# Patient Record
Sex: Female | Born: 1955 | Race: White | Hispanic: No | Marital: Single | Smoking: Current every day smoker
Health system: Southern US, Community
[De-identification: ages and names within clinical notes are randomized; demographics above are authoritative.]

## PROBLEM LIST (undated history)

## (undated) DIAGNOSIS — G8921 Chronic pain due to trauma: Secondary | ICD-10-CM

## (undated) DIAGNOSIS — G43719 Chronic migraine without aura, intractable, without status migrainosus: Secondary | ICD-10-CM

## (undated) DIAGNOSIS — K56609 Unspecified intestinal obstruction, unspecified as to partial versus complete obstruction: Secondary | ICD-10-CM

## (undated) DIAGNOSIS — Z87440 Personal history of urinary (tract) infections: Secondary | ICD-10-CM

## (undated) DIAGNOSIS — R292 Abnormal reflex: Secondary | ICD-10-CM

## (undated) DIAGNOSIS — IMO0002 Reserved for concepts with insufficient information to code with codable children: Secondary | ICD-10-CM

## (undated) DIAGNOSIS — G629 Polyneuropathy, unspecified: Secondary | ICD-10-CM

## (undated) DIAGNOSIS — F064 Anxiety disorder due to known physiological condition: Secondary | ICD-10-CM

## (undated) DIAGNOSIS — K219 Gastro-esophageal reflux disease without esophagitis: Secondary | ICD-10-CM

## (undated) DIAGNOSIS — M62838 Other muscle spasm: Secondary | ICD-10-CM

## (undated) DIAGNOSIS — M1990 Unspecified osteoarthritis, unspecified site: Secondary | ICD-10-CM

## (undated) DIAGNOSIS — G8220 Paraplegia, unspecified: Secondary | ICD-10-CM

## (undated) DIAGNOSIS — Z87442 Personal history of urinary calculi: Secondary | ICD-10-CM

## (undated) DIAGNOSIS — M1710 Unilateral primary osteoarthritis, unspecified knee: Secondary | ICD-10-CM

## (undated) HISTORY — DX: Chronic migraine without aura, intractable, without status migrainosus: G43.719

## (undated) HISTORY — DX: Chronic pain due to trauma: G89.21

## (undated) HISTORY — DX: Anxiety disorder due to known physiological condition: F06.4

## (undated) HISTORY — DX: Polyneuropathy, unspecified: G62.9

## (undated) HISTORY — DX: Paraplegia, unspecified: G82.20

## (undated) HISTORY — DX: Abnormal reflex: R29.2

## (undated) HISTORY — DX: Gastro-esophageal reflux disease without esophagitis: K21.9

## (undated) HISTORY — DX: Personal history of urinary calculi: Z87.442

## (undated) HISTORY — DX: Personal history of urinary (tract) infections: Z87.440

## (undated) HISTORY — DX: Unspecified osteoarthritis, unspecified site: M19.90

## (undated) HISTORY — DX: Unspecified intestinal obstruction, unspecified as to partial versus complete obstruction: K56.609

## (undated) HISTORY — DX: Unilateral primary osteoarthritis, unspecified knee: M17.10

## (undated) HISTORY — DX: Reserved for concepts with insufficient information to code with codable children: IMO0002

## (undated) HISTORY — DX: Other muscle spasm: M62.838

## (undated) HISTORY — PX: SMALL INTESTINE SURGERY: SHX150

## (undated) HISTORY — PX: LITHOTRIPSY: SUR834

---

## 1979-01-29 HISTORY — PX: OTHER SURGICAL HISTORY: SHX169

## 2008-12-13 ENCOUNTER — Encounter: Admission: RE | Admit: 2008-12-13 | Discharge: 2008-12-13 | Payer: Self-pay | Admitting: Internal Medicine

## 2009-10-25 ENCOUNTER — Emergency Department (HOSPITAL_COMMUNITY): Admission: EM | Admit: 2009-10-25 | Discharge: 2009-10-25 | Payer: Self-pay | Admitting: Emergency Medicine

## 2009-11-18 ENCOUNTER — Emergency Department (HOSPITAL_COMMUNITY): Admission: EM | Admit: 2009-11-18 | Discharge: 2009-11-18 | Payer: Self-pay | Admitting: Emergency Medicine

## 2009-11-25 ENCOUNTER — Emergency Department (HOSPITAL_COMMUNITY): Admission: EM | Admit: 2009-11-25 | Discharge: 2009-11-25 | Payer: Self-pay | Admitting: Emergency Medicine

## 2010-01-08 ENCOUNTER — Encounter
Admission: RE | Admit: 2010-01-08 | Discharge: 2010-01-25 | Payer: Self-pay | Source: Home / Self Care | Attending: Emergency Medicine | Admitting: Emergency Medicine

## 2010-05-07 ENCOUNTER — Other Ambulatory Visit: Payer: Self-pay | Admitting: Geriatric Medicine

## 2010-05-07 DIAGNOSIS — Z1231 Encounter for screening mammogram for malignant neoplasm of breast: Secondary | ICD-10-CM

## 2010-05-10 ENCOUNTER — Ambulatory Visit: Payer: Self-pay

## 2010-05-16 ENCOUNTER — Ambulatory Visit
Admission: RE | Admit: 2010-05-16 | Discharge: 2010-05-16 | Disposition: A | Discharge status: Home / Self Care | Payer: Medicare Other | Source: Ambulatory Visit | Attending: Geriatric Medicine | Admitting: Geriatric Medicine

## 2010-05-16 DIAGNOSIS — Z1231 Encounter for screening mammogram for malignant neoplasm of breast: Secondary | ICD-10-CM

## 2010-05-31 ENCOUNTER — Emergency Department (HOSPITAL_COMMUNITY): Payer: Medicare Other

## 2010-05-31 ENCOUNTER — Emergency Department (HOSPITAL_COMMUNITY)
Admission: EM | Admit: 2010-05-31 | Discharge: 2010-05-31 | Disposition: A | Discharge status: Home / Self Care | Payer: Medicare Other | Attending: Emergency Medicine | Admitting: Emergency Medicine

## 2010-05-31 DIAGNOSIS — Z87828 Personal history of other (healed) physical injury and trauma: Secondary | ICD-10-CM | POA: Insufficient documentation

## 2010-05-31 DIAGNOSIS — R1032 Left lower quadrant pain: Secondary | ICD-10-CM | POA: Insufficient documentation

## 2010-05-31 DIAGNOSIS — K5900 Constipation, unspecified: Secondary | ICD-10-CM | POA: Insufficient documentation

## 2010-05-31 DIAGNOSIS — G8250 Quadriplegia, unspecified: Secondary | ICD-10-CM | POA: Insufficient documentation

## 2010-05-31 DIAGNOSIS — E119 Type 2 diabetes mellitus without complications: Secondary | ICD-10-CM | POA: Insufficient documentation

## 2010-05-31 LAB — CBC
MCH: 29.8 pg (ref 26.0–34.0)
MCHC: 33.8 g/dL (ref 30.0–36.0)
MCV: 88.3 fL (ref 78.0–100.0)
Platelets: 264 10*3/uL (ref 150–400)

## 2010-05-31 LAB — WET PREP, GENITAL: Yeast Wet Prep HPF POC: NONE SEEN

## 2010-05-31 LAB — DIFFERENTIAL
Basophils Relative: 1 % (ref 0–1)
Eosinophils Absolute: 0.1 10*3/uL (ref 0.0–0.7)
Monocytes Absolute: 0.4 10*3/uL (ref 0.1–1.0)
Monocytes Relative: 7 % (ref 3–12)

## 2010-05-31 LAB — URINALYSIS, ROUTINE W REFLEX MICROSCOPIC
Bilirubin Urine: NEGATIVE
Glucose, UA: NEGATIVE mg/dL
Ketones, ur: NEGATIVE mg/dL
Nitrite: NEGATIVE
Protein, ur: NEGATIVE mg/dL

## 2010-05-31 LAB — URINE MICROSCOPIC-ADD ON

## 2010-05-31 LAB — COMPREHENSIVE METABOLIC PANEL
ALT: 16 U/L (ref 0–35)
Albumin: 3 g/dL — ABNORMAL LOW (ref 3.5–5.2)
Alkaline Phosphatase: 202 U/L — ABNORMAL HIGH (ref 39–117)
BUN: 13 mg/dL (ref 6–23)
Chloride: 99 mEq/L (ref 96–112)
Potassium: 3.8 mEq/L (ref 3.5–5.1)
Sodium: 137 mEq/L (ref 135–145)
Total Bilirubin: 0.2 mg/dL — ABNORMAL LOW (ref 0.3–1.2)
Total Protein: 6.9 g/dL (ref 6.0–8.3)

## 2010-05-31 MED ORDER — IOHEXOL 300 MG/ML  SOLN
100.0000 mL | Freq: Once | INTRAMUSCULAR | Status: AC | PRN
  Administered 2010-05-31: 100 mL via INTRAVENOUS

## 2010-06-01 LAB — GC/CHLAMYDIA PROBE AMP, GENITAL
Chlamydia, DNA Probe: NEGATIVE
GC Probe Amp, Genital: NEGATIVE

## 2010-06-12 ENCOUNTER — Ambulatory Visit (INDEPENDENT_AMBULATORY_CARE_PROVIDER_SITE_OTHER): Payer: Medicare Other | Admitting: Gastroenterology

## 2010-06-12 ENCOUNTER — Encounter: Payer: Self-pay | Admitting: Gastroenterology

## 2010-06-12 VITALS — BP 82/54 | HR 80

## 2010-06-12 DIAGNOSIS — Z1211 Encounter for screening for malignant neoplasm of colon: Secondary | ICD-10-CM

## 2010-06-12 DIAGNOSIS — G8250 Quadriplegia, unspecified: Secondary | ICD-10-CM
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
## 2010-06-12 NOTE — Patient Instructions (Addendum)
You will be set up for a colonoscopy at [facility] with propofol. Please call [phone] and ask for Patty when you are ready to schedule.  Some of the available dates are 6/7,6/14,6/21,6/28,7/5,7/12,7/26.   You will be admitted the day before to get you prepped for the procedure. We will coordinate with you and your friend who will be available to pick you up at hospital the day of the procedure help you get home safely. A copy of this information will be made available to Dr. Marletta Lor (physicians home visits).

## 2010-06-14 ENCOUNTER — Telehealth: Payer: Self-pay | Admitting: Gastroenterology

## 2010-06-14 NOTE — Telephone Encounter (Signed)
Pt has been scheduled for Colon at [facility] on 07/05/10 with MAC to be admitted the day before.  Sean at bed placement was called and he set up the admission for the day before.  Pt had requsted that they call her on 07/03/10 with the time of arrival but Gregary Signs was not able to give that time until the day she is to be admitted,  I called the pt and explained this and she will have the [facility] van pick her up early and drip her off at the hospital and she will just wait until a bed is available.  Amy is also going to be informed of the admit.

## 2010-07-04 ENCOUNTER — Observation Stay (HOSPITAL_COMMUNITY)
Admission: RE | Admit: 2010-07-04 | Discharge: 2010-07-05 | Disposition: A | Discharge status: Home / Self Care | Payer: Medicare Other | Source: Ambulatory Visit | Attending: Gastroenterology | Admitting: Gastroenterology

## 2010-07-04 DIAGNOSIS — Z01818 Encounter for other preprocedural examination: Secondary | ICD-10-CM

## 2010-07-04 DIAGNOSIS — Z79899 Other long term (current) drug therapy: Secondary | ICD-10-CM | POA: Insufficient documentation

## 2010-07-04 DIAGNOSIS — M1990 Unspecified osteoarthritis, unspecified site: Secondary | ICD-10-CM | POA: Insufficient documentation

## 2010-07-04 DIAGNOSIS — M542 Cervicalgia: Secondary | ICD-10-CM | POA: Insufficient documentation

## 2010-07-04 DIAGNOSIS — N201 Calculus of ureter: Secondary | ICD-10-CM | POA: Insufficient documentation

## 2010-07-04 DIAGNOSIS — G8250 Quadriplegia, unspecified: Secondary | ICD-10-CM | POA: Insufficient documentation

## 2010-07-04 DIAGNOSIS — M26609 Unspecified temporomandibular joint disorder, unspecified side: Secondary | ICD-10-CM | POA: Insufficient documentation

## 2010-07-04 DIAGNOSIS — Z1211 Encounter for screening for malignant neoplasm of colon: Principal | ICD-10-CM | POA: Insufficient documentation

## 2010-07-04 DIAGNOSIS — K219 Gastro-esophageal reflux disease without esophagitis: Secondary | ICD-10-CM | POA: Insufficient documentation

## 2010-07-04 DIAGNOSIS — G894 Chronic pain syndrome: Secondary | ICD-10-CM | POA: Insufficient documentation

## 2010-07-04 DIAGNOSIS — G43909 Migraine, unspecified, not intractable, without status migrainosus: Secondary | ICD-10-CM | POA: Insufficient documentation

## 2010-07-05 ENCOUNTER — Encounter: Payer: Self-pay | Admitting: Physician Assistant

## 2010-07-05 ENCOUNTER — Other Ambulatory Visit: Payer: Medicare Other | Admitting: Gastroenterology

## 2010-07-05 DIAGNOSIS — G8250 Quadriplegia, unspecified: Secondary | ICD-10-CM

## 2010-07-05 DIAGNOSIS — Z1211 Encounter for screening for malignant neoplasm of colon: Secondary | ICD-10-CM

## 2010-07-24 ENCOUNTER — Emergency Department (HOSPITAL_COMMUNITY)
Admission: EM | Admit: 2010-07-24 | Discharge: 2010-07-24 | Disposition: A | Discharge status: Home / Self Care | Payer: Medicare Other | Attending: Emergency Medicine | Admitting: Emergency Medicine

## 2010-07-24 ENCOUNTER — Emergency Department (HOSPITAL_COMMUNITY): Payer: Medicare Other

## 2010-07-24 DIAGNOSIS — R0609 Other forms of dyspnea: Secondary | ICD-10-CM | POA: Insufficient documentation

## 2010-07-24 DIAGNOSIS — R0989 Other specified symptoms and signs involving the circulatory and respiratory systems: Secondary | ICD-10-CM | POA: Insufficient documentation

## 2010-07-24 DIAGNOSIS — R109 Unspecified abdominal pain: Secondary | ICD-10-CM | POA: Insufficient documentation

## 2010-07-24 DIAGNOSIS — L8990 Pressure ulcer of unspecified site, unspecified stage: Secondary | ICD-10-CM | POA: Insufficient documentation

## 2010-07-24 DIAGNOSIS — L89109 Pressure ulcer of unspecified part of back, unspecified stage: Secondary | ICD-10-CM | POA: Insufficient documentation

## 2010-07-24 DIAGNOSIS — G8250 Quadriplegia, unspecified: Secondary | ICD-10-CM | POA: Insufficient documentation

## 2010-07-24 DIAGNOSIS — E876 Hypokalemia: Secondary | ICD-10-CM | POA: Insufficient documentation

## 2010-07-24 LAB — DIFFERENTIAL
Basophils Absolute: 0 10*3/uL (ref 0.0–0.1)
Basophils Relative: 0 % (ref 0–1)
Eosinophils Absolute: 0.1 10*3/uL (ref 0.0–0.7)
Monocytes Relative: 7 % (ref 3–12)
Neutro Abs: 7 10*3/uL (ref 1.7–7.7)
Neutrophils Relative %: 77 % (ref 43–77)

## 2010-07-24 LAB — COMPREHENSIVE METABOLIC PANEL
AST: 19 U/L (ref 0–37)
Albumin: 3 g/dL — ABNORMAL LOW (ref 3.5–5.2)
BUN: 7 mg/dL (ref 6–23)
Calcium: 9.5 mg/dL (ref 8.4–10.5)
Creatinine, Ser: <0.47 mg/dL — ABNORMAL LOW (ref 0.50–1.10)

## 2010-07-24 LAB — URINALYSIS, ROUTINE W REFLEX MICROSCOPIC
Glucose, UA: NEGATIVE mg/dL
Leukocytes, UA: NEGATIVE
Nitrite: NEGATIVE
Protein, ur: NEGATIVE mg/dL
pH: 7.5 (ref 5.0–8.0)

## 2010-07-24 LAB — LIPASE, BLOOD: Lipase: 12 U/L (ref 11–59)

## 2010-07-24 LAB — CBC
Hemoglobin: 12.8 g/dL (ref 12.0–15.0)
Platelets: 381 10*3/uL (ref 150–400)
RBC: 4.36 MIL/uL (ref 3.87–5.11)
WBC: 9.1 10*3/uL (ref 4.0–10.5)

## 2010-07-24 LAB — OCCULT BLOOD, POC DEVICE: Fecal Occult Bld: NEGATIVE

## 2010-07-26 LAB — URINE CULTURE: Culture  Setup Time: 201206262107

## 2010-09-17 ENCOUNTER — Emergency Department (HOSPITAL_COMMUNITY): Payer: Medicare Other

## 2010-09-17 ENCOUNTER — Emergency Department (HOSPITAL_COMMUNITY)
Admission: EM | Admit: 2010-09-17 | Discharge: 2010-09-18 | Disposition: A | Discharge status: Home / Self Care | Payer: Medicare Other | Attending: Emergency Medicine | Admitting: Emergency Medicine

## 2010-09-17 DIAGNOSIS — R209 Unspecified disturbances of skin sensation: Secondary | ICD-10-CM | POA: Insufficient documentation

## 2010-09-17 DIAGNOSIS — L8991 Pressure ulcer of unspecified site, stage 1: Secondary | ICD-10-CM | POA: Insufficient documentation

## 2010-09-17 DIAGNOSIS — N390 Urinary tract infection, site not specified: Secondary | ICD-10-CM | POA: Insufficient documentation

## 2010-09-17 DIAGNOSIS — L89109 Pressure ulcer of unspecified part of back, unspecified stage: Secondary | ICD-10-CM | POA: Insufficient documentation

## 2010-09-17 DIAGNOSIS — G8250 Quadriplegia, unspecified: Secondary | ICD-10-CM | POA: Insufficient documentation

## 2010-09-17 DIAGNOSIS — R0602 Shortness of breath: Secondary | ICD-10-CM | POA: Insufficient documentation

## 2010-09-17 DIAGNOSIS — J329 Chronic sinusitis, unspecified: Secondary | ICD-10-CM | POA: Insufficient documentation

## 2010-09-17 DIAGNOSIS — R29898 Other symptoms and signs involving the musculoskeletal system: Secondary | ICD-10-CM | POA: Insufficient documentation

## 2010-09-17 LAB — GLUCOSE, CAPILLARY: Glucose-Capillary: 109 mg/dL — ABNORMAL HIGH (ref 70–99)

## 2010-09-18 ENCOUNTER — Encounter (HOSPITAL_COMMUNITY): Payer: Self-pay

## 2010-09-18 ENCOUNTER — Emergency Department (HOSPITAL_COMMUNITY): Payer: Medicare Other

## 2010-09-18 LAB — CBC
HCT: 35.1 % — ABNORMAL LOW (ref 36.0–46.0)
Hemoglobin: 12.3 g/dL (ref 12.0–15.0)
MCH: 30 pg (ref 26.0–34.0)
MCV: 85.6 fL (ref 78.0–100.0)
Platelets: 251 10*3/uL (ref 150–400)
RBC: 4.1 MIL/uL (ref 3.87–5.11)
WBC: 6.8 10*3/uL (ref 4.0–10.5)

## 2010-09-18 LAB — URINALYSIS, ROUTINE W REFLEX MICROSCOPIC
Bilirubin Urine: NEGATIVE
Glucose, UA: NEGATIVE mg/dL
Hgb urine dipstick: NEGATIVE
Ketones, ur: NEGATIVE mg/dL
Nitrite: NEGATIVE
Specific Gravity, Urine: 1.025 (ref 1.005–1.030)
pH: 6.5 (ref 5.0–8.0)

## 2010-09-18 LAB — POCT I-STAT, CHEM 8
BUN: 14 mg/dL (ref 6–23)
Calcium, Ion: 1.16 mmol/L (ref 1.12–1.32)
Chloride: 103 mEq/L (ref 96–112)
HCT: 38 % (ref 36.0–46.0)
Potassium: 4.3 mEq/L (ref 3.5–5.1)

## 2010-09-18 LAB — URINE MICROSCOPIC-ADD ON

## 2010-09-18 LAB — DIFFERENTIAL
Eosinophils Absolute: 0.1 10*3/uL (ref 0.0–0.7)
Lymphocytes Relative: 24 % (ref 12–46)
Lymphs Abs: 1.6 10*3/uL (ref 0.7–4.0)
Monocytes Relative: 7 % (ref 3–12)
Neutrophils Relative %: 67 % (ref 43–77)

## 2010-09-19 LAB — URINE CULTURE: Colony Count: NO GROWTH
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
## 2011-10-18 ENCOUNTER — Emergency Department (HOSPITAL_COMMUNITY)
Admission: EM | Admit: 2011-10-18 | Discharge: 2011-10-19 | Disposition: A | Discharge status: Home / Self Care | Payer: Medicare Other | Attending: Emergency Medicine | Admitting: Emergency Medicine

## 2011-10-18 DIAGNOSIS — G8250 Quadriplegia, unspecified: Secondary | ICD-10-CM | POA: Insufficient documentation

## 2011-10-18 DIAGNOSIS — F17200 Nicotine dependence, unspecified, uncomplicated: Secondary | ICD-10-CM | POA: Insufficient documentation

## 2011-10-18 DIAGNOSIS — K219 Gastro-esophageal reflux disease without esophagitis: Secondary | ICD-10-CM | POA: Insufficient documentation

## 2011-10-18 DIAGNOSIS — Z882 Allergy status to sulfonamides status: Secondary | ICD-10-CM | POA: Insufficient documentation

## 2011-10-18 DIAGNOSIS — Z833 Family history of diabetes mellitus: Secondary | ICD-10-CM | POA: Insufficient documentation

## 2011-10-18 DIAGNOSIS — F411 Generalized anxiety disorder: Secondary | ICD-10-CM | POA: Insufficient documentation

## 2011-10-18 DIAGNOSIS — Z8249 Family history of ischemic heart disease and other diseases of the circulatory system: Secondary | ICD-10-CM | POA: Insufficient documentation

## 2011-10-18 DIAGNOSIS — Z8489 Family history of other specified conditions: Secondary | ICD-10-CM | POA: Insufficient documentation

## 2011-10-18 DIAGNOSIS — K625 Hemorrhage of anus and rectum: Secondary | ICD-10-CM | POA: Insufficient documentation

## 2011-10-18 DIAGNOSIS — Z809 Family history of malignant neoplasm, unspecified: Secondary | ICD-10-CM | POA: Insufficient documentation

## 2011-10-18 NOTE — ED Notes (Signed)
Bed:WA14<BR> Expected date:<BR> Expected time:<BR> Means of arrival:<BR> Comments:<BR> ems

## 2011-10-19 LAB — CBC WITH DIFFERENTIAL/PLATELET
Basophils Absolute: 0 10*3/uL (ref 0.0–0.1)
Basophils Relative: 1 % (ref 0–1)
Eosinophils Absolute: 0.2 10*3/uL (ref 0.0–0.7)
Hemoglobin: 13.9 g/dL (ref 12.0–15.0)
MCH: 31.2 pg (ref 26.0–34.0)
MCV: 91.7 fL (ref 78.0–100.0)
Monocytes Absolute: 0.6 10*3/uL (ref 0.1–1.0)
Neutro Abs: 2.9 10*3/uL (ref 1.7–7.7)
Platelets: 222 10*3/uL (ref 150–400)
RBC: 4.45 MIL/uL (ref 3.87–5.11)
WBC: 6.4 10*3/uL (ref 4.0–10.5)

## 2011-10-19 LAB — BASIC METABOLIC PANEL
Calcium: 9.3 mg/dL (ref 8.4–10.5)
Chloride: 102 mEq/L (ref 96–112)
Creatinine, Ser: 0.39 mg/dL — ABNORMAL LOW (ref 0.50–1.10)
GFR calc Af Amer: >90 mL/min (ref >90)
GFR calc non Af Amer: >90 mL/min (ref >90)

## 2011-10-19 NOTE — ED Notes (Signed)
[facility] called for transport.  

## 2011-10-19 NOTE — ED Notes (Signed)
Pt is alseep,  NAD
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
## 2011-10-20 NOTE — ED Provider Notes (Signed)
Medical screening examination/treatment/procedure(s) were performed by non-physician practitioner and as supervising physician I was immediately available for consultation/collaboration.  Sunnie Nielsen, MD 10/20/11 [phone]

## 2012-06-28 ENCOUNTER — Encounter (HOSPITAL_COMMUNITY): Payer: Self-pay | Admitting: Emergency Medicine

## 2012-06-28 ENCOUNTER — Emergency Department (HOSPITAL_COMMUNITY)
Admission: EM | Admit: 2012-06-28 | Discharge: 2012-06-29 | Disposition: A | Discharge status: Home / Self Care | Payer: Medicare Other | Attending: Emergency Medicine | Admitting: Emergency Medicine

## 2012-06-28 ENCOUNTER — Emergency Department (HOSPITAL_COMMUNITY): Payer: Medicare Other

## 2012-06-28 DIAGNOSIS — Z87448 Personal history of other diseases of urinary system: Secondary | ICD-10-CM | POA: Insufficient documentation

## 2012-06-28 DIAGNOSIS — Z87442 Personal history of urinary calculi: Secondary | ICD-10-CM | POA: Insufficient documentation

## 2012-06-28 DIAGNOSIS — Y9389 Activity, other specified: Secondary | ICD-10-CM | POA: Insufficient documentation

## 2012-06-28 DIAGNOSIS — Z8739 Personal history of other diseases of the musculoskeletal system and connective tissue: Secondary | ICD-10-CM | POA: Insufficient documentation

## 2012-06-28 DIAGNOSIS — G43719 Chronic migraine without aura, intractable, without status migrainosus: Secondary | ICD-10-CM | POA: Insufficient documentation

## 2012-06-28 DIAGNOSIS — Z7982 Long term (current) use of aspirin: Secondary | ICD-10-CM | POA: Insufficient documentation

## 2012-06-28 DIAGNOSIS — Z8719 Personal history of other diseases of the digestive system: Secondary | ICD-10-CM | POA: Insufficient documentation

## 2012-06-28 DIAGNOSIS — F17200 Nicotine dependence, unspecified, uncomplicated: Secondary | ICD-10-CM | POA: Insufficient documentation

## 2012-06-28 DIAGNOSIS — G8220 Paraplegia, unspecified: Secondary | ICD-10-CM | POA: Insufficient documentation

## 2012-06-28 DIAGNOSIS — IMO0002 Reserved for concepts with insufficient information to code with codable children: Secondary | ICD-10-CM | POA: Insufficient documentation

## 2012-06-28 DIAGNOSIS — Y92009 Unspecified place in unspecified non-institutional (private) residence as the place of occurrence of the external cause: Secondary | ICD-10-CM | POA: Insufficient documentation

## 2012-06-28 DIAGNOSIS — Z79899 Other long term (current) drug therapy: Secondary | ICD-10-CM | POA: Insufficient documentation

## 2012-06-28 DIAGNOSIS — K219 Gastro-esophageal reflux disease without esophagitis: Secondary | ICD-10-CM | POA: Insufficient documentation

## 2012-06-28 DIAGNOSIS — M6282 Rhabdomyolysis: Secondary | ICD-10-CM | POA: Insufficient documentation

## 2012-06-28 DIAGNOSIS — K920 Hematemesis: Secondary | ICD-10-CM

## 2012-06-28 DIAGNOSIS — Z87440 Personal history of urinary (tract) infections: Secondary | ICD-10-CM | POA: Insufficient documentation

## 2012-06-28 DIAGNOSIS — F064 Anxiety disorder due to known physiological condition: Secondary | ICD-10-CM | POA: Insufficient documentation

## 2012-06-28 DIAGNOSIS — W19XXXA Unspecified fall, initial encounter: Secondary | ICD-10-CM

## 2012-06-28 DIAGNOSIS — W010XXA Fall on same level from slipping, tripping and stumbling without subsequent striking against object, initial encounter: Secondary | ICD-10-CM | POA: Insufficient documentation

## 2012-06-28 DIAGNOSIS — R748 Abnormal levels of other serum enzymes: Secondary | ICD-10-CM | POA: Insufficient documentation

## 2012-06-28 DIAGNOSIS — M1710 Unilateral primary osteoarthritis, unspecified knee: Secondary | ICD-10-CM | POA: Insufficient documentation

## 2012-06-28 LAB — CBC WITH DIFFERENTIAL/PLATELET
Basophils Absolute: 0 10*3/uL (ref 0.0–0.1)
Basophils Relative: 0 % (ref 0–1)
Eosinophils Absolute: 0 10*3/uL (ref 0.0–0.7)
Eosinophils Relative: 0 % (ref 0–5)
HCT: 39.9 % (ref 36.0–46.0)
Hemoglobin: 13.8 g/dL (ref 12.0–15.0)
Lymphocytes Relative: 7 % — ABNORMAL LOW (ref 12–46)
Lymphs Abs: 0.9 10*3/uL (ref 0.7–4.0)
MCH: 30.9 pg (ref 26.0–34.0)
MCHC: 34.6 g/dL (ref 30.0–36.0)
MCV: 89.3 fL (ref 78.0–100.0)
Monocytes Absolute: 0.8 10*3/uL (ref 0.1–1.0)
Monocytes Relative: 6 % (ref 3–12)
Neutro Abs: 11.8 10*3/uL — ABNORMAL HIGH (ref 1.7–7.7)
Neutrophils Relative %: 87 % — ABNORMAL HIGH (ref 43–77)
Platelets: 259 10*3/uL (ref 150–400)
RBC: 4.47 MIL/uL (ref 3.87–5.11)
RDW: 14.6 % (ref 11.5–15.5)
WBC: 13.5 10*3/uL — ABNORMAL HIGH (ref 4.0–10.5)

## 2012-06-28 LAB — BASIC METABOLIC PANEL
BUN: 18 mg/dL (ref 6–23)
CO2: 25 mEq/L (ref 19–32)
Calcium: 9.6 mg/dL (ref 8.4–10.5)
Chloride: 100 mEq/L (ref 96–112)
Creatinine, Ser: 0.45 mg/dL — ABNORMAL LOW (ref 0.50–1.10)
GFR calc Af Amer: >90 mL/min (ref >90)
GFR calc non Af Amer: >90 mL/min (ref >90)
Glucose, Bld: 118 mg/dL — ABNORMAL HIGH (ref 70–99)
Potassium: 4.4 mEq/L (ref 3.5–5.1)
Sodium: 138 mEq/L (ref 135–145)

## 2012-06-28 LAB — CG4 I-STAT (LACTIC ACID): Lactic Acid, Venous: 1.23 mmol/L (ref 0.5–2.2)

## 2012-06-28 LAB — CK: Total CK: 1092 U/L — ABNORMAL HIGH (ref 7–177)

## 2012-06-28 MED ORDER — SODIUM CHLORIDE 0.9 % IV BOLUS (SEPSIS)
1000.0000 mL | Freq: Once | INTRAVENOUS | Status: AC
  Administered 2012-06-28: 1000 mL via INTRAVENOUS

## 2012-06-28 MED ORDER — SODIUM CHLORIDE 0.9 % IV BOLUS (SEPSIS)
1000.0000 mL | Freq: Once | INTRAVENOUS | Status: DC
Start: 2012-06-29 — End: 2012-06-29

## 2012-06-28 NOTE — ED Provider Notes (Signed)
History    57 year old female presenting after falling at home. He has a history of quadriplegia was unable to when she fell. She initially lost her balance. Patient fell onto her butt with her legs extended out in front of her. Her torso was extending forward as well. Her abdomen and chest are essentially cross her legs and she is unable to push self backup. She was in this position for almost 9 hours. Several hours into this she began spitting up small amount of saliva with blood in it. Denies abdominal or CP. NO n/v. NO blood thinners. No dizziness, lightheadedness or sob.   CSN: [id number]  Arrival date & time 06/28/12  2204   First MD Initiated Contact with Patient 06/28/12 2226      Chief Complaint  Patient presents with  . Hematemesis    (Consider location/radiation/quality/duration/timing/severity/associated sxs/prior treatment) HPI  Past Medical History  Diagnosis Date  . Paraplegia   . Esophageal reflux   . Osteoarthrosis, unspecified whether generalized or localized, lower leg   . Chronic migraine without aura, with intractable migraine, so stated, without mention of status migrainosus   . Chronic pain due to trauma   . Spasm of muscle   . Unspecified disorder of urethra and urinary tract   . 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 Abnormal reflex   . Personal history of urinary (tract) infection   . Arthritis   . History of kidney stones   . Bowel obstruction     after surgery in 1984  . Neuropathy     stomach    Past Surgical History  Procedure Laterality Date  . Bladder stones  1981  . Compound screws  1981    left foot  . Crutchfiled tongs  1981  . Small intestine surgery      x 3    Family History  Problem Relation Age of Onset  . Cancer Father   . Cancer Mother   . Heart disease Sister   . Diabetes Sister   . Heart disease Mother   . Tuberculosis      History  Substance Use Topics  . Smoking status: Current Every Day Smoker   . Smokeless tobacco: Not on file  . Alcohol Use: No    OB History   Grav Para Term Preterm Abortions TAB SAB Ect Mult Living                  Review of Systems  All systems reviewed and negative, other than as noted in HPI.   Allergies  Sulfa antibiotics  Home Medications   Current Outpatient Rx  Name  Route  Sig  Dispense  Refill  . amitriptyline (ELAVIL) 10 MG tablet   Oral   Take 10 mg by mouth 3 (three) times daily.           Marland Kitchen aspirin 325 MG tablet   Oral   Take 325 mg by mouth daily.           Marland Kitchen b complex vitamins capsule   Oral   Take 1 capsule by mouth daily.           . baclofen (LIORESAL) 10 MG tablet   Oral   Take 20 mg by mouth 4 (four) times daily.           . calcium carbonate (TUMS) 500 MG chewable tablet   Oral   Chew 2 tablets by mouth daily. scheduled         .  clotrimazole-betamethasone (LOTRISONE) lotion   Topical   Apply 1 application topically daily as needed. For dry skin         . Cyanocobalamin (VITAMIN B 12 PO)   Oral   Take 250 mg by mouth daily.           Marland Kitchen denosumab (PROLIA) 60 MG/ML SOLN injection   Subcutaneous   Inject 60 mg into the skin every 6 (six) months. Administer in upper arm, thigh, or abdomen         . ergocalciferol (VITAMIN D2) 50000 UNITS capsule   Oral   Take 50,000 Units by mouth once a week. monday         . furosemide (LASIX) 40 MG tablet   Oral   Take 40 mg by mouth daily.           Marland Kitchen lidocaine (LIDODERM) 5 %   Transdermal   Place 1 patch onto the skin daily as needed. For pain.Remove & Discard patch within 12 hours or as directed by MD         . magnesium oxide (MAG-OX) 400 MG tablet   Oral   Take 400 mg by mouth 2 (two) times daily.           . Multiple Vitamin (MULTIVITAMIN) capsule   Oral   Take 1 capsule by mouth daily.           Marland Kitchen NITROFURANTOIN MACROCRYSTAL PO   Oral   Take 100 mg by mouth daily.           Marland Kitchen omeprazole (PRILOSEC) 20 MG capsule   Oral    Take 20 mg by mouth daily.           Marland Kitchen oxybutynin (DITROPAN) 5 MG tablet   Oral   Take 5 mg by mouth 3 (three) times daily.         Marland Kitchen oxycodone (OXY-IR) 5 MG capsule   Oral   Take 5-10 mg by mouth every 4 (four) hours as needed. For pain         . potassium chloride (KLOR-CON) 10 MEQ CR tablet   Oral   Take 10 mEq by mouth daily.           . promethazine (PHENERGAN) 25 MG tablet   Oral   Take 25 mg by mouth every 6 (six) hours as needed. For nausea         . sertraline (ZOLOFT) 100 MG tablet   Oral   Take 150 mg by mouth daily.           Marland Kitchen zolpidem (AMBIEN) 5 MG tablet   Oral   Take 5 mg by mouth at bedtime as needed. For sleep           There were no vitals taken for this visit.  Physical Exam  Nursing note and vitals reviewed. Constitutional: She appears well-developed and well-nourished. No distress.  HENT:  Head: Normocephalic and atraumatic.  Eyes: Conjunctivae are normal. Right eye exhibits no discharge. Left eye exhibits no discharge.  Neck: Neck supple.  Cardiovascular: Normal rate, regular rhythm and normal heart sounds.  Exam reveals no gallop and no friction rub.   No murmur heard. Pulmonary/Chest: Effort normal and breath sounds normal. No respiratory distress.  Abdominal: Soft. She exhibits no distension. There is no tenderness.  Musculoskeletal: She exhibits no edema and no tenderness.  Atrophy LE  Neurological: She is alert.  Skin: Skin is warm and dry.  Erythema anterior proximal  thighs with small area or blistering. Mild erythema to sacral region w/o skin breakdown.   Psychiatric: She has a normal mood and affect. Her behavior is normal. Thought content normal.    ED Course  Procedures (including critical care time)  Labs Reviewed - No data to display No results found.  EKG:  Rhythm: normal sinus w/ PACs Vent. rate 63 BPM PR interval 188 ms QRS duration 86 ms QT/QTc 424/434 ms ST segments: NS ST changes   1. Elevated CK    2. Fall, initial encounter   3. Hematemesis       MDM  57yF with evidence of mild rhabdomyolysis. Mild elevation in CK. K and renal function normal. Given IVF. Improved symptoms and no new complaints. Unclear etiology of hematemesis. H/H normal. No thinners. HD stable. No continued symptoms in ED.         Raeford Razor, MD 07/02/12 [phone]
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
## 2012-06-29 LAB — URINALYSIS, ROUTINE W REFLEX MICROSCOPIC
Bilirubin Urine: NEGATIVE
Glucose, UA: NEGATIVE mg/dL
Ketones, ur: NEGATIVE mg/dL
Nitrite: POSITIVE — AB
Protein, ur: 30 mg/dL — AB
Specific Gravity, Urine: 1.022 (ref 1.005–1.030)
Urobilinogen, UA: 0.2 mg/dL (ref 0.0–1.0)
pH: 7 (ref 5.0–8.0)

## 2012-06-29 LAB — URINE MICROSCOPIC-ADD ON

## 2012-07-01 LAB — URINE CULTURE: Colony Count: >=100000

## 2012-07-02 ENCOUNTER — Telehealth (HOSPITAL_COMMUNITY): Payer: Self-pay | Admitting: Emergency Medicine

## 2012-07-02 NOTE — ED Notes (Signed)
Post ED Visit - Positive Culture Follow-up: Successful Patient Follow-Up  Culture assessed and recommendations reviewed by: []  Wes Dulaney, Pharm.D., BCPS []  Celedonio Miyamoto, [address].D., BCPS [x]  Georgina Pillion, Pharm.D., BCPS []  [place], [address].D., BCPS, AAHIVP []  Estella Husk, Pharm.D., BCPS, AAHIVP  Positive urine culture  [x]  Patient discharged without antimicrobial prescription and treatment is now indicated []  Organism is resistant to prescribed ED discharge antimicrobial []  Patient with positive blood cultures  Changes discussed with ED provider: Trixie Dredge PA-C New antibiotic prescription Amoxicillin 500 mg TID x 10 days Called to [facility] on [address]. [phone])  Contacted patient, date 07/02/12, time 1102   Kylie A Holland 07/02/2012, 11:04 AM

## 2012-07-02 NOTE — Progress Notes (Signed)
ED Antimicrobial Stewardship Positive Culture Follow Up   Hailey Cooper is an 57 y.o. female who presented to [facility] on 06/28/2012 with a chief complaint of fall  Chief Complaint  Patient presents with  . Hematemesis    Recent Results (from the past 720 hour(s))  URINE CULTURE     Status: None   Collection Time    06/29/12  1:15 AM      Result Value Range Status   Specimen Description URINE, CATHETERIZED   Final   Special Requests NONE   Final   Culture  Setup Time 06/29/2012 11:34   Final   Colony Count >=100,000 COLONIES/ML   Final   Culture ESCHERICHIA COLI   Final   Report Status 07/01/2012 FINAL   Final   Organism ID, Bacteria ESCHERICHIA COLI   Final    []  Treated with , organism resistant to prescribed antimicrobial [x]  Patient discharged originally without antimicrobial agent and treatment is now indicated  New antibiotic prescription: Amoxicillin 500 mg three times a day for 10 days  ED Provider: Trixie Dredge, PA-C  Rolley Sims 07/02/2012, 11:23 AM Infectious Diseases Pharmacist Phone# [phone]

## 2014-04-22 ENCOUNTER — Encounter (HOSPITAL_COMMUNITY): Payer: Self-pay | Admitting: *Deleted

## 2014-04-22 ENCOUNTER — Emergency Department (HOSPITAL_COMMUNITY)
Admission: EM | Admit: 2014-04-22 | Discharge: 2014-04-22 | Disposition: A | Discharge status: Home / Self Care | Payer: Medicare HMO | Attending: Emergency Medicine | Admitting: Emergency Medicine

## 2014-04-22 DIAGNOSIS — N200 Calculus of kidney: Secondary | ICD-10-CM | POA: Diagnosis present

## 2014-04-22 DIAGNOSIS — F419 Anxiety disorder, unspecified: Secondary | ICD-10-CM | POA: Insufficient documentation

## 2014-04-22 DIAGNOSIS — G43709 Chronic migraine without aura, not intractable, without status migrainosus: Secondary | ICD-10-CM | POA: Insufficient documentation

## 2014-04-22 DIAGNOSIS — G8921 Chronic pain due to trauma: Secondary | ICD-10-CM | POA: Insufficient documentation

## 2014-04-22 DIAGNOSIS — Z720 Tobacco use: Secondary | ICD-10-CM | POA: Insufficient documentation

## 2014-04-22 DIAGNOSIS — K219 Gastro-esophageal reflux disease without esophagitis: Secondary | ICD-10-CM | POA: Diagnosis not present

## 2014-04-22 DIAGNOSIS — T8351XA Infection and inflammatory reaction due to indwelling urinary catheter, initial encounter: Secondary | ICD-10-CM | POA: Insufficient documentation

## 2014-04-22 DIAGNOSIS — Z79899 Other long term (current) drug therapy: Secondary | ICD-10-CM | POA: Insufficient documentation

## 2014-04-22 DIAGNOSIS — Z8669 Personal history of other diseases of the nervous system and sense organs: Secondary | ICD-10-CM | POA: Diagnosis not present

## 2014-04-22 DIAGNOSIS — Y846 Urinary catheterization as the cause of abnormal reaction of the patient, or of later complication, without mention of misadventure at the time of the procedure: Secondary | ICD-10-CM | POA: Insufficient documentation

## 2014-04-22 DIAGNOSIS — Z7982 Long term (current) use of aspirin: Secondary | ICD-10-CM | POA: Insufficient documentation

## 2014-04-22 DIAGNOSIS — N390 Urinary tract infection, site not specified: Secondary | ICD-10-CM

## 2014-04-22 DIAGNOSIS — M1990 Unspecified osteoarthritis, unspecified site: Secondary | ICD-10-CM | POA: Diagnosis not present

## 2014-04-22 DIAGNOSIS — R319 Hematuria, unspecified: Secondary | ICD-10-CM

## 2014-04-22 DIAGNOSIS — T83511A Infection and inflammatory reaction due to indwelling urethral catheter, initial encounter: Secondary | ICD-10-CM

## 2014-04-22 LAB — CBC WITH DIFFERENTIAL/PLATELET
BASOS ABS: 0 10*3/uL (ref 0.0–0.1)
BASOS PCT: 0 % (ref 0–1)
EOS PCT: 3 % (ref 0–5)
Eosinophils Absolute: 0.2 10*3/uL (ref 0.0–0.7)
HEMATOCRIT: 38.9 % (ref 36.0–46.0)
HEMOGLOBIN: 13 g/dL (ref 12.0–15.0)
LYMPHS PCT: 26 % (ref 12–46)
Lymphs Abs: 1.9 10*3/uL (ref 0.7–4.0)
MCH: 31.6 pg (ref 26.0–34.0)
MCHC: 33.4 g/dL (ref 30.0–36.0)
MCV: 94.6 fL (ref 78.0–100.0)
Monocytes Absolute: 0.5 10*3/uL (ref 0.1–1.0)
Monocytes Relative: 7 % (ref 3–12)
NEUTROS ABS: 4.8 10*3/uL (ref 1.7–7.7)
Neutrophils Relative %: 64 % (ref 43–77)
Platelets: 236 10*3/uL (ref 150–400)
RBC: 4.11 MIL/uL (ref 3.87–5.11)
RDW: 14.2 % (ref 11.5–15.5)
WBC: 7.5 10*3/uL (ref 4.0–10.5)

## 2014-04-22 LAB — URINALYSIS, ROUTINE W REFLEX MICROSCOPIC
Bilirubin Urine: NEGATIVE
Glucose, UA: NEGATIVE mg/dL
Ketones, ur: NEGATIVE mg/dL
NITRITE: POSITIVE — AB
Protein, ur: 100 mg/dL — AB
Specific Gravity, Urine: 1.011 (ref 1.005–1.030)
UROBILINOGEN UA: 0.2 mg/dL (ref 0.0–1.0)
pH: 8 (ref 5.0–8.0)

## 2014-04-22 LAB — BASIC METABOLIC PANEL
ANION GAP: 8 (ref 5–15)
BUN: 18 mg/dL (ref 6–23)
CO2: 25 mmol/L (ref 19–32)
CREATININE: 0.32 mg/dL — AB (ref 0.50–1.10)
Calcium: 8.9 mg/dL (ref 8.4–10.5)
Chloride: 106 mmol/L (ref 96–112)
Glucose, Bld: 92 mg/dL (ref 70–99)
POTASSIUM: 3.9 mmol/L (ref 3.5–5.1)
Sodium: 139 mmol/L (ref 135–145)

## 2014-04-22 LAB — URINE MICROSCOPIC-ADD ON

## 2014-04-22 NOTE — ED Provider Notes (Signed)
CSN: [id number]     Arrival date & time 04/22/14  1108 History   First MD Initiated Contact with Patient 04/22/14 1138     Chief Complaint  Patient presents with  . Nephrolithiasis  . Hematuria     (Consider location/radiation/quality/duration/timing/severity/associated sxs/prior Treatment) Patient is a 59 y.o. female presenting with hematuria. The history is provided by the patient. No language interpreter was used.  Hematuria Pertinent negatives include no nausea or vomiting.  [name] is a 59 y.o white female with a history of paraplegia, kidney stones, and ilioconduit bag who presents with hematuria and urinary retention that began 4 days ago and has progressively worsened with inability to cath herself.  She called her urologist a couple of days ago and they did a CT scan which showed 4 stones in the bladder and two in the left kidney. She says she had an obstructing urethral stone and she is unable to cath herself as she usually does once in the morning and once in the evening.  She denies any fever, chills, abdominal pain, or abdominal distention.  Past Medical History  Diagnosis Date  . Paraplegia   . Esophageal reflux   . Osteoarthrosis, unspecified whether generalized or localized, lower leg   . Chronic migraine without aura, with intractable migraine, so stated, without mention of status migrainosus   . Chronic pain due to trauma   . Spasm of muscle   . Unspecified disorder of urethra and urinary tract   . 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 Abnormal reflex   . Personal history of urinary (tract) infection   . Arthritis   . History of kidney stones   . Bowel obstruction     after surgery in 1984  . Neuropathy     stomach   Past Surgical History  Procedure Laterality Date  . Bladder stones  1981  . Compound screws  1981    left foot  . Crutchfiled tongs  1981  . Small intestine surgery      x 3   Family History  Problem Relation Age of  Onset  . Cancer Father   . Cancer Mother   . Heart disease Sister   . Diabetes Sister   . Heart disease Mother   . Tuberculosis     History  Substance Use Topics  . Smoking status: Current Every Day Smoker  . Smokeless tobacco: Not on file  . Alcohol Use: No   OB History    No data available     Review of Systems  Gastrointestinal: Negative for nausea, vomiting and abdominal distention.  Genitourinary: Positive for hematuria and difficulty urinating.  Musculoskeletal: Negative for back pain.  Neurological: Negative for dizziness.  All other systems reviewed and are negative.     Allergies  Sulfa antibiotics  Home Medications   Prior to Admission medications   Medication Sig Start Date End Date Taking? Authorizing Provider  amitriptyline (ELAVIL) 10 MG tablet Take 10 mg by mouth 3 (three) times daily.     Yes Historical Provider, MD  aspirin 81 MG tablet Take 81 mg by mouth 2 (two) times daily.   Yes Historical Provider, MD  baclofen (LIORESAL) 20 MG tablet Take 20 mg by mouth 4 (four) times daily.   Yes Historical Provider, MD  calcitonin, salmon, (MIACALCIN/FORTICAL) 200 UNIT/ACT nasal spray Place 1 spray into alternate nostrils daily.   Yes Historical Provider, MD  calcium carbonate (TUMS) 500 MG chewable tablet Chew 2 tablets by  mouth daily. scheduled   Yes Historical Provider, MD  clotrimazole-betamethasone (LOTRISONE) lotion Apply 1 application topically daily as needed. For dry skin   Yes Historical Provider, MD  furosemide (LASIX) 40 MG tablet Take 40 mg by mouth daily as needed for fluid.    Yes Historical Provider, MD  magnesium oxide (MAG-OX) 400 MG tablet Take 400 mg by mouth 2 (two) times daily.     Yes Historical Provider, MD  MELATONIN PO Take 1 capsule by mouth at bedtime as needed (sleep).   Yes Historical Provider, MD  Menthol, Topical Analgesic, 5 % PADS Apply 1 patch topically every 12 (twelve) hours as needed (pain).   Yes Historical Provider, MD   NITROFURANTOIN MACROCRYSTAL PO Take 50 mg by mouth daily.    Yes Historical Provider, MD  oxybutynin (DITROPAN) 5 MG tablet Take 5 mg by mouth 2 (two) times daily.    Yes Historical Provider, MD  oxycodone (OXY-IR) 5 MG capsule Take 5-10 mg by mouth every 4 (four) hours as needed. For pain   Yes Historical Provider, MD  potassium chloride (KLOR-CON) 10 MEQ CR tablet Take 10 mEq by mouth every other day.    Yes Historical Provider, MD  promethazine (PHENERGAN) 25 MG tablet Take 25 mg by mouth every 6 (six) hours as needed. For nausea   Yes Historical Provider, MD  sertraline (ZOLOFT) 50 MG tablet Take 50 mg by mouth daily.   Yes Historical Provider, MD  zolpidem (AMBIEN) 5 MG tablet Take 5 mg by mouth at bedtime as needed. For sleep   Yes Historical Provider, MD  lidocaine (LIDODERM) 5 % Place 1 patch onto the skin daily as needed. For pain.Remove & Discard patch within 12 hours or as directed by MD    Historical Provider, MD   BP 177/127 mmHg  Pulse 57  Temp(Src) 98.1 F (36.7 C) (Oral)  Resp 18  SpO2 100% Physical Exam  Constitutional: She is oriented to person, place, and time. She appears well-developed and well-nourished.  HENT:  Head: Normocephalic and atraumatic.  Eyes: Conjunctivae are normal.  Neck: Normal range of motion. Neck supple.  Cardiovascular: Normal rate, regular rhythm and normal heart sounds.   Pulmonary/Chest: Effort normal and breath sounds normal.  Abdominal: Soft. There is no tenderness.  Right sided ilioconduit in place with a hematuria.   Musculoskeletal: Normal range of motion.  Neurological: She is alert and oriented to person, place, and time.  Skin: Skin is warm and dry.  Nursing note and vitals reviewed.   ED Course  Procedures (including critical care time) Labs Review Labs Reviewed  BASIC METABOLIC PANEL - Abnormal; Notable for the following:    Creatinine, Ser 0.32 (*)    All other components within normal limits  URINALYSIS, ROUTINE W REFLEX  MICROSCOPIC - Abnormal; Notable for the following:    Color, Urine AMBER (*)    APPearance TURBID (*)    Hgb urine dipstick LARGE (*)    Protein, ur 100 (*)    Nitrite POSITIVE (*)    Leukocytes, UA MODERATE (*)    All other components within normal limits  URINE MICROSCOPIC-ADD ON - Abnormal; Notable for the following:    Bacteria, UA MANY (*)    All other components within normal limits  URINE CULTURE  CBC WITH DIFFERENTIAL/PLATELET    Imaging Review No results found.   EKG Interpretation None      MDM   Final diagnoses:  Hematuria  Urinary tract infection associated with catheterization of  urinary tract, initial encounter  She is having hematuria out of the ilioconduit and is also unable to cath herself through the urethra. No abdominal pain, no dysuria, no urinary frequency. Her exam is normal. I spoke to Dr. McDermit at [facility] who states that he is unaware of her surgery because it was done 20 years ago at [facility].  He does not know why she is self cathing when she has an ilioconduit and does not see a need for her to be admitted if she is afebrile and in no pain.  She can f/u with Dr. Patsi Sears in the office. Her creatinine is .32 but the patient is a paraplegic.  Bladder scan only showed 130cc of urine in the bladder. Once her labs came back she had a UTI but no WBC. Nursing staff was able to cath the patient and there was hematuria and sediment that appeared white like tissue.  There was no blockage.  I spoke to Dr. Isabel Caprice who was aware of the patient and did not feel it necessary to see the patient in the ED.  He said, if the patient is afebrile with no WBC he would have her f/u in the office and to culture the urine without putting her on an antibiotic yet.  Many of these patients become resistant to antibiotics and do not really have an infection.   If the culture comes back and she does need antibiotics, someone will call her.       Catha Gosselin, PA-C 04/23/14 1018  Tilden Fossa, MD 04/23/14 [phone]

## 2014-04-22 NOTE — ED Notes (Addendum)
Per Jerilee HohStacy West CN called [facility] who report pick up time 2200/2300. Deatra RobinsonKaren Jones secretary called to get update from [facility] regarding pick up time; [facility] reports will send first available truck.   Ice water given to pt per request.

## 2014-04-22 NOTE — ED Notes (Signed)
Bed: WA14 Expected date:  Expected time:  Means of arrival:  Comments: EMS UTI 

## 2014-04-22 NOTE — ED Notes (Addendum)
Per EMS, pt complains of bleeding in her urethra and urostomy since Tuesday. Pt has hx of injury to C5, is paraplegic. Pt has hx of kidney stones. Pt saw her doctor on Monday, had a CT showing kidney stones. Pt took 10mg  oxycodone at 1030AM today, states her pain in her urethra is 6/10. Pt states she feels like she needs to void, but when she is catheterized she meets resistance in her urethra and her urostomy. .Marland Kitchen

## 2014-04-22 NOTE — ED Notes (Signed)
Pt. Bladder scan was 186cc. Nurse was notified.

## 2014-04-22 NOTE — Discharge Instructions (Signed)
Catheter-Associated Urinary Tract Infection FAQs °WHAT IS "CATHETER-ASSOCIATED" URINARY TRACT INFECTION? °A urinary tract infection (also called "UTI") is an infection in the urinary system, which includes the bladder (which stores the urine) and the kidneys (which filter the blood to make urine). Germs (for example, bacteria or yeasts) do not normally live in these areas; but if germs are introduced, an infection can occur. If you have a urinary catheter, germs can travel along the catheter and cause an infection in your bladder or your kidney; in that case it is called a catheter-associated urinary tract infection (or "CA-UTI").  °WHAT IS A URINARY CATHETER? °A urinary catheter is a thin tube placed in the bladder to drain urine. Urine drains through the tube into a bag that collects the urine. A urinary  °catheter may be used: °· If you are not able to urinate on your own. °· To measure the amount of urine that you make, for example, during intensive care. °· During and after some types of surgery. °· During some tests of the kidneys and bladder . °People with urinary catheters have a much higher chance of getting a urinary tract infection than people who don't have a catheter. °HOW DO I GET A CATHETER-ASSOCIATED URINARY TRACT INFECTION (CA-UTI)? °If germs enter the urinary tract, they may cause an infection. Many of the germs that cause a catheter-associated urinary tract infection are common germs found in your intestines that do not usually cause an infection there. Germs can enter the urinary tract when the catheter is being put in or while the catheter remains in the bladder.  °WHAT ARE THE SYMPTOMS OF A URINARY TRACT INFECTION?  °Some of the common symptoms of a urinary tract infection are: °· Burning or pain in the lower abdomen (that is, below the stomach). °· Fever. °· Bloody urine may be a sign of infection, but is also caused by other problems . °· Burning during urination or an increase in the  frequency of urination after the catheter is removed. °Sometimes people with catheter-associated urinary tract infections do not have these symptoms of infection. °CAN CATHETER-ASSOCIATED URINARY TRACT INFECTIONS BE TREATED? °Yes, most catheter-associated urinary tract infections can be treated with antibiotics and removal or change of the catheter. Your doctor will determine which antibiotic is best for you.  °WHAT ARE SOME OF THE THINGS THAT HOSPITALS ARE DOING TO PREVENT CATHETER-ASSOCIATED URINARY TRACT INFECTIONS? °To prevent urinary tract infections, doctors and nurses take the following actions.  °Catheter insertion °· Catheters are put in only when necessary and they are removed as soon as possible. °· Only properly trained persons insert catheters using sterile ("clean") technique. °· The skin in the area where the catheter will be inserted is cleaned before inserting the catheter. °· Other methods to drain the urine are sometimes used, such as: °¨ External catheters in men (these look like condoms and are placed over the penis rather than into the penis) °¨ Putting a temporary catheter in to drain the urine and removing it right away. This is called intermittent urethral catheterization. °Catheter care °· Healthcare providers clean their hands by washing them with soap and water or using an alcohol-based hand rub before and after touching your catheter. °¨ If you do not see your providers clean their hands, please ask them to do so. °· Avoid disconnecting the catheter and drain tube. This helps to prevent germs from getting into the catheter tube. °· The catheter is secured to the leg to prevent pulling on the   catheter.  Avoid twisting or kinking the catheter.  Keep the bag lower than the bladder to prevent urine from backflowing to the bladder.  Empty the bag regularly. The drainage spout should not touch anything while emptying the bag. WHAT CAN I DO TO HELP PREVENT CATHETER-ASSOCIATED URINARY  TRACT INFECTIONS IF I HAVE A CATHETER?  Always clean your hands before and after doing catheter care.  Always keep your urine bag below the level of your bladder.  Do not tug or pull on the tubing.  Do not twist or kink the catheter tubing.  Ask your healthcare provider each day if you still need the catheter. WHAT DO I NEED TO DO WHEN I GO HOME FROM THE HOSPITAL?  If you will be going home with a catheter, your doctor or nurse should explain everything you need to know about taking care of the catheter. Make sure you understand how to care for it before you leave the hospital.  If you develop any of the symptoms of a urinary tract infection, such as burning or pain in the lower abdomen, fever, or an increase in the frequency of urination, contact your doctor or nurse immediately.  Before you go home, make sure you know who to contact if you have questions or problems after you get home. If you have questions, please ask your doctor or nurse. Developed and co-sponsored by [employer] for [employer] of [place] [phone](SHEA); Infectious Diseases Society of America (IDSA); The [facility]; Association for Professionals in Infection Control and Epidemiology (APIC); Center for Disease Control (CDC); and The Joint Commission Document Released: 10/09/2011 Document Reviewed: 10/09/2011 [facility] Patient Information 2015 [employer] [place]. This information is not intended to replace advice given to you by your health care provider. Make sure you discuss any questions you have with your health care provider.  We will call you with the results of the culture in the case that you need antibiotics.  Follow up with Dr. Patsi Searsannenbaum.

## 2014-04-26 LAB — URINE CULTURE

## 2014-04-27 ENCOUNTER — Telehealth (HOSPITAL_BASED_OUTPATIENT_CLINIC_OR_DEPARTMENT_OTHER): Payer: Self-pay | Admitting: Emergency Medicine

## 2014-04-27 NOTE — Progress Notes (Signed)
ED Antimicrobial Stewardship Positive Culture Follow Up   Hailey Cooper is an 59 y.o. female who [facility] with a chief complaint of  Chief Complaint  Patient presents with  . Nephrolithiasis  . Hematuria    Recent Results (from the past 720 hour(s))  Urine culture     Status: None   Collection Time: 04/22/14  2:36 PM  Result Value Ref Range Status   Specimen Description URINE, CLEAN CATCH  Final   Special Requests NONE  Final   Colony Count   Final    >=100,000 COLONIES/ML Performed at [facility]    Culture   Final    ENTEROCOCCUS SPECIES Performed at [facility]    Report Status 04/26/2014 FINAL  Final   Organism ID, Bacteria ENTEROCOCCUS SPECIES  Final      Susceptibility   Enterococcus species - MIC*    AMPICILLIN <=2 SENSITIVE Sensitive     LEVOFLOXACIN 0.5 SENSITIVE Sensitive     NITROFURANTOIN <=16 SENSITIVE Sensitive     VANCOMYCIN RESISTANT      TETRACYCLINE >=16 RESISTANT Resistant     * ENTEROCOCCUS SPECIES    [name] has an ilioconduit and self-caths due to paraplegia.    Note her case was discussed with Urology while in the ED.  Urology recommended that she not receive any antibiotics and to have urology follow-up.  Discussed with Emilia BeckKaitlyn Szekalski, PA-C.  Will send this culture result to Dr. Patsi Searsannenbaum, her urologist, for review and to make a decision regarding any need for antibiotic therapy.   Sallee Provencalurner, Lorne Winkels S 04/27/2014, 12:10 PM Infectious Diseases Pharmacist Phone# [phone]

## 2014-04-27 NOTE — Telephone Encounter (Signed)
Results faxed to Dr. Imelda Pillowannenbaum's office for review of urine culture per ID pharmacist

## 2014-05-09 ENCOUNTER — Other Ambulatory Visit: Payer: Self-pay | Admitting: Urology

## 2014-06-24 ENCOUNTER — Inpatient Hospital Stay (HOSPITAL_COMMUNITY): Admission: RE | Admit: 2014-06-24 | Payer: Medicare Other | Source: Ambulatory Visit | Admitting: Urology

## 2014-06-24 ENCOUNTER — Encounter (HOSPITAL_COMMUNITY): Admission: RE | Payer: Self-pay | Source: Ambulatory Visit

## 2014-06-24 SURGERY — CYSTOSCOPY, WITH BLADDER CALCULUS LITHOLAPAXY
Anesthesia: General

## 2014-09-18 ENCOUNTER — Emergency Department (HOSPITAL_COMMUNITY): Payer: Medicare HMO

## 2014-09-18 ENCOUNTER — Emergency Department (HOSPITAL_COMMUNITY)
Admission: EM | Admit: 2014-09-18 | Discharge: 2014-09-18 | Disposition: A | Discharge status: Home / Self Care | Payer: Medicare HMO | Attending: Emergency Medicine | Admitting: Emergency Medicine

## 2014-09-18 ENCOUNTER — Encounter (HOSPITAL_COMMUNITY): Payer: Self-pay

## 2014-09-18 DIAGNOSIS — Z7982 Long term (current) use of aspirin: Secondary | ICD-10-CM | POA: Diagnosis not present

## 2014-09-18 DIAGNOSIS — Z720 Tobacco use: Secondary | ICD-10-CM | POA: Insufficient documentation

## 2014-09-18 DIAGNOSIS — M25552 Pain in left hip: Secondary | ICD-10-CM | POA: Insufficient documentation

## 2014-09-18 DIAGNOSIS — M25551 Pain in right hip: Secondary | ICD-10-CM | POA: Insufficient documentation

## 2014-09-18 DIAGNOSIS — M62838 Other muscle spasm: Secondary | ICD-10-CM | POA: Diagnosis not present

## 2014-09-18 DIAGNOSIS — Z792 Long term (current) use of antibiotics: Secondary | ICD-10-CM | POA: Insufficient documentation

## 2014-09-18 DIAGNOSIS — M25559 Pain in unspecified hip: Secondary | ICD-10-CM

## 2014-09-18 DIAGNOSIS — Z79899 Other long term (current) drug therapy: Secondary | ICD-10-CM | POA: Diagnosis not present

## 2014-09-18 DIAGNOSIS — F418 Other specified anxiety disorders: Secondary | ICD-10-CM | POA: Diagnosis not present

## 2014-09-18 DIAGNOSIS — Z87442 Personal history of urinary calculi: Secondary | ICD-10-CM | POA: Insufficient documentation

## 2014-09-18 DIAGNOSIS — Z87448 Personal history of other diseases of urinary system: Secondary | ICD-10-CM | POA: Insufficient documentation

## 2014-09-18 DIAGNOSIS — Z8719 Personal history of other diseases of the digestive system: Secondary | ICD-10-CM | POA: Insufficient documentation

## 2014-09-18 DIAGNOSIS — G8921 Chronic pain due to trauma: Secondary | ICD-10-CM | POA: Diagnosis not present

## 2014-09-18 DIAGNOSIS — M1990 Unspecified osteoarthritis, unspecified site: Secondary | ICD-10-CM | POA: Insufficient documentation

## 2014-09-18 MED ORDER — CYCLOBENZAPRINE HCL 10 MG PO TABS
5.0000 mg | ORAL_TABLET | Freq: Once | ORAL | Status: AC
  Administered 2014-09-18: 5 mg via ORAL
  Filled 2014-09-18: qty 1

## 2014-09-18 MED ORDER — CYCLOBENZAPRINE HCL 5 MG PO TABS: 5.0000 mg | ORAL_TABLET | Freq: Three times a day (TID) | ORAL | Status: AC | PRN

## 2014-09-18 NOTE — ED Notes (Signed)
Bed: WA08 Expected date:  Expected time:  Means of arrival:  Comments: EMS 

## 2014-09-18 NOTE — ED Notes (Addendum)
Awake. Verbally responsive. A/O x4. Resp even and unlabored. No audible adventitious breath sounds noted. ABC's intact. Family at bedside. 

## 2014-09-18 NOTE — ED Notes (Signed)
Patient brought from home by EMS. Patient reports h/a and lt hip pain that has been gotten progressively worse over the past year. Patient denies injury and trauma. Positive PS. No bruising to left hip. No anomalies. Palpable bony protrusion on rt hip. PMD aware.

## 2014-09-18 NOTE — Discharge Instructions (Signed)
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
## 2014-09-18 NOTE — ED Notes (Signed)
Awake. Verbally responsive. A/O x4. Resp even and unlabored. No audible adventitious breath sounds noted. ABC's intact. Family at bedside. 

## 2014-09-18 NOTE — ED Notes (Signed)
Pt self catheterized. Pt reported generalized abdominal spasms during catheterization. Will make MD aware.

## 2014-11-02 ENCOUNTER — Ambulatory Visit (INDEPENDENT_AMBULATORY_CARE_PROVIDER_SITE_OTHER): Payer: Medicare HMO | Admitting: Podiatry

## 2014-11-02 ENCOUNTER — Encounter: Payer: Self-pay | Admitting: Podiatry

## 2014-11-02 VITALS — BP 134/95 | HR 96 | Resp 12

## 2014-11-02 DIAGNOSIS — L609 Nail disorder, unspecified: Secondary | ICD-10-CM

## 2014-11-02 DIAGNOSIS — L608 Other nail disorders: Secondary | ICD-10-CM

## 2014-11-02 NOTE — Patient Instructions (Signed)
Removed bandage on second left toe 1-2 days Apply topical antibiotic ointment to the end the second left toe daily until a scab forms Return as needed or every 3 months
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
## 2014-11-17 ENCOUNTER — Other Ambulatory Visit: Payer: Self-pay | Admitting: Orthopedic Surgery

## 2014-11-17 ENCOUNTER — Other Ambulatory Visit (HOSPITAL_COMMUNITY): Payer: Self-pay | Admitting: Orthopedic Surgery

## 2014-11-17 DIAGNOSIS — M542 Cervicalgia: Secondary | ICD-10-CM

## 2014-11-17 DIAGNOSIS — M25511 Pain in right shoulder: Secondary | ICD-10-CM

## 2014-12-05 ENCOUNTER — Ambulatory Visit (HOSPITAL_COMMUNITY)
Admission: RE | Admit: 2014-12-05 | Discharge: 2014-12-05 | Disposition: A | Discharge status: Home / Self Care | Payer: Medicare HMO | Source: Ambulatory Visit | Attending: Orthopedic Surgery | Admitting: Orthopedic Surgery

## 2014-12-05 DIAGNOSIS — M2578 Osteophyte, vertebrae: Secondary | ICD-10-CM | POA: Insufficient documentation

## 2014-12-05 DIAGNOSIS — M25511 Pain in right shoulder: Secondary | ICD-10-CM | POA: Diagnosis not present

## 2014-12-05 DIAGNOSIS — M542 Cervicalgia: Secondary | ICD-10-CM | POA: Insufficient documentation

## 2014-12-05 DIAGNOSIS — M19011 Primary osteoarthritis, right shoulder: Secondary | ICD-10-CM | POA: Insufficient documentation

## 2014-12-05 DIAGNOSIS — R609 Edema, unspecified: Secondary | ICD-10-CM | POA: Insufficient documentation

## 2014-12-05 DIAGNOSIS — G8220 Paraplegia, unspecified: Secondary | ICD-10-CM | POA: Insufficient documentation

## 2014-12-05 DIAGNOSIS — M4802 Spinal stenosis, cervical region: Secondary | ICD-10-CM | POA: Diagnosis not present

## 2014-12-05 DIAGNOSIS — M7551 Bursitis of right shoulder: Secondary | ICD-10-CM | POA: Insufficient documentation

## 2014-12-05 DIAGNOSIS — M7591 Shoulder lesion, unspecified, right shoulder: Secondary | ICD-10-CM | POA: Insufficient documentation

## 2015-02-15 ENCOUNTER — Encounter: Payer: Self-pay | Admitting: Podiatry

## 2015-02-15 ENCOUNTER — Ambulatory Visit (INDEPENDENT_AMBULATORY_CARE_PROVIDER_SITE_OTHER): Payer: Medicare HMO | Admitting: Podiatry

## 2015-02-15 DIAGNOSIS — L608 Other nail disorders: Secondary | ICD-10-CM

## 2015-02-15 DIAGNOSIS — L609 Nail disorder, unspecified: Secondary | ICD-10-CM | POA: Diagnosis not present
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
## 2015-05-17 ENCOUNTER — Encounter: Payer: Self-pay | Admitting: Podiatry

## 2015-05-17 ENCOUNTER — Ambulatory Visit (INDEPENDENT_AMBULATORY_CARE_PROVIDER_SITE_OTHER): Payer: Medicare HMO | Admitting: Podiatry

## 2015-05-17 DIAGNOSIS — L609 Nail disorder, unspecified: Secondary | ICD-10-CM | POA: Diagnosis not present

## 2015-05-17 DIAGNOSIS — L608 Other nail disorders: Secondary | ICD-10-CM

## 2015-05-17 NOTE — Patient Instructions (Signed)
There is a small amount of bleeding in the fifth right toenail after trimming which was treated with Betadine and a Band-Aid. Removed Band-Aid 1-3 days and continue to apply topical Betadine or topical antibiotic ointment daily with a Band-Aid until a scab forms
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
## 2015-08-16 ENCOUNTER — Encounter: Payer: Self-pay | Admitting: Podiatry

## 2015-08-16 ENCOUNTER — Ambulatory Visit (INDEPENDENT_AMBULATORY_CARE_PROVIDER_SITE_OTHER): Payer: Medicare HMO | Admitting: Podiatry

## 2015-08-16 DIAGNOSIS — L609 Nail disorder, unspecified: Secondary | ICD-10-CM

## 2015-08-16 DIAGNOSIS — L608 Other nail disorders: Secondary | ICD-10-CM
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
## 2015-08-20 ENCOUNTER — Emergency Department (HOSPITAL_COMMUNITY)
Admission: EM | Admit: 2015-08-20 | Discharge: 2015-08-20 | Disposition: A | Discharge status: Home / Self Care | Payer: Medicare HMO | Attending: Emergency Medicine | Admitting: Emergency Medicine

## 2015-08-20 ENCOUNTER — Encounter (HOSPITAL_COMMUNITY): Payer: Self-pay | Admitting: Emergency Medicine

## 2015-08-20 DIAGNOSIS — N751 Abscess of Bartholin's gland: Secondary | ICD-10-CM | POA: Insufficient documentation

## 2015-08-20 DIAGNOSIS — F17210 Nicotine dependence, cigarettes, uncomplicated: Secondary | ICD-10-CM | POA: Diagnosis not present

## 2015-08-20 DIAGNOSIS — L02214 Cutaneous abscess of groin: Secondary | ICD-10-CM | POA: Diagnosis present

## 2015-08-20 DIAGNOSIS — Z7982 Long term (current) use of aspirin: Secondary | ICD-10-CM | POA: Diagnosis not present

## 2015-08-20 MED ORDER — CEPHALEXIN 500 MG PO CAPS: ORAL_CAPSULE | ORAL | 0 refills | Status: DC

## 2015-08-20 NOTE — ED Notes (Signed)
PT DISCHARGED. INSTRUCTIONS AND PRESCRIPTION GIVEN. AAOX4. PT IN NO APPARENT DISTRESS OR PAIN. THE OPPORTUNITY TO ASK QUESTIONS WAS PROVIDED. 

## 2015-08-20 NOTE — ED Provider Notes (Signed)
[facility]-EMERGENCY DEPT Provider Note   CSN: [id number] Arrival date & time: 08/20/15  1122  First Provider Contact:  None       History   Chief Complaint Chief Complaint  Patient presents with  . Abscess    HPI Hailey Cooper is a 60 y.o. female with a PMHx of anxiety, migraines, GERD, kidney stones, paraplegia, and recurrent UTIs, who presents to the ED with complaints of abscess to the left groin 1-2 days. Patient is a paraplegic, has a home health nurse that helps her, but her friend came yesterday and noticed an abscess in her left groin that was swollen, red, and draining a white substance. They believe it was most likely present for longer than 1-2 days but they're not sure. She cannot feel anything from the waist down therefore she denies any pain secondary to her paraplegia. She has not tried anything for her symptoms, no known migraine factors. She denies any red streaking, warmth, fevers, chills, chest pain, shortness breath, abdominal pain, nausea, vomiting, diarrhea, constipation, or vaginal bleeding. She has a urostomy and therefore cannot report if she has any urinary symptoms at this time.   The history is provided by the patient and medical records. No language interpreter was used.  Abscess  Location:  Pelvis Pelvic abscess location:  Groin Abscess quality: draining and redness   Abscess quality: no warmth   Red streaking: no   Duration:  1 day Progression:  Unchanged Chronicity:  New Relieved by:  None tried Worsened by:  Nothing Ineffective treatments:  None tried Associated symptoms: no fever, no nausea and no vomiting     Past Medical History:  Diagnosis Date  . Abnormal reflex   . 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 Arthritis   . Bowel obstruction (HCC)    after surgery in 1984  . Chronic migraine without aura, with intractable migraine, so stated, without mention of status migrainosus   . Chronic pain due to trauma   . Esophageal  reflux   . History of kidney stones   . Neuropathy (HCC)    stomach  . Osteoarthrosis, unspecified whether generalized or localized, lower leg   . Paraplegia (HCC)   . Personal history of urinary (tract) infection   . Spasm of muscle   . Unspecified disorder of urethra and urinary tract     There are no active problems to display for this patient.   Past Surgical History:  Procedure Laterality Date  . bladder stones  1981  . compound screws  1981   left foot  . crutchfiled tongs  1981  . LITHOTRIPSY    . SMALL INTESTINE SURGERY     x 3    OB History    No data available       Home Medications    Prior to Admission medications   Medication Sig Start Date End Date Taking? Authorizing Provider  amitriptyline (ELAVIL) 10 MG tablet Take 10 mg by mouth 2 (two) times daily.     Historical Provider, MD  aspirin 81 MG tablet Take 81 mg by mouth 2 (two) times daily.    Historical Provider, MD  baclofen (LIORESAL) 20 MG tablet Take 20 mg by mouth 4 (four) times daily.    Historical Provider, MD  calcitonin, salmon, (MIACALCIN/FORTICAL) 200 UNIT/ACT nasal spray Place 1 spray into alternate nostrils daily.    Historical Provider, MD  calcium carbonate (TUMS) 500 MG chewable tablet Chew 2 tablets by mouth daily. scheduled  Historical Provider, MD  cephALEXin (KEFLEX) 500 MG capsule 2 caps po bid x 7 days 08/20/15   Cordelia Bessinger Camprubi-Soms, PA-C  cyclobenzaprine (FLEXERIL) 5 MG tablet Take 1 tablet (5 mg total) by mouth 3 (three) times daily as needed for muscle spasms. 09/18/14   Linwood Dibbles, MD  furosemide (LASIX) 40 MG tablet Take 40 mg by mouth daily as needed for fluid.     Historical Provider, MD  ketotifen (ZADITOR) 0.025 % ophthalmic solution Place 1 drop into both eyes daily.    Historical Provider, MD  magnesium oxide (MAG-OX) 400 MG tablet Take 400 mg by mouth 2 (two) times daily.      Historical Provider, MD  MELATONIN PO Take 1 capsule by mouth at bedtime as needed (sleep).     Historical Provider, MD  Menthol-Methyl Salicylate (MUSCLE RUB) 10-15 % CREA Apply 1 application topically daily as needed for muscle pain.    Historical Provider, MD  Multiple Vitamin (MULTIVITAMIN WITH MINERALS) TABS tablet Take 1 tablet by mouth daily.    Historical Provider, MD  oxybutynin (DITROPAN) 5 MG tablet Take 5 mg by mouth 2 (two) times daily.     Historical Provider, MD  oxycodone (OXY-IR) 5 MG capsule Take 5-10 mg by mouth every 4 (four) hours as needed for pain.     Historical Provider, MD  potassium chloride (KLOR-CON) 10 MEQ CR tablet Take 10 mEq by mouth every other day.     Historical Provider, MD  promethazine (PHENERGAN) 25 MG tablet Take 25 mg by mouth every 6 (six) hours as needed for nausea or vomiting. For nausea    Historical Provider, MD  sertraline (ZOLOFT) 50 MG tablet Take 50 mg by mouth daily.    Historical Provider, MD  zolpidem (AMBIEN) 5 MG tablet Take 5 mg by mouth at bedtime as needed for sleep.     Historical Provider, MD    Family History Family History  Problem Relation Age of Onset  . Cancer Father   . Cancer Mother   . Heart disease Mother   . Heart disease Sister   . Diabetes Sister   . Tuberculosis      Social History Social History  Substance Use Topics  . Smoking status: Current Every Day Smoker    Packs/day: 0.50    Types: Cigarettes  . Smokeless tobacco: Never Used  . Alcohol use No     Allergies   Cedar leaf oil and Sulfa antibiotics   Review of Systems Review of Systems  Constitutional: Negative for chills and fever.  Respiratory: Negative for shortness of breath.   Cardiovascular: Negative for chest pain.  Gastrointestinal: Negative for abdominal pain, constipation, diarrhea, nausea and vomiting.  Genitourinary: Negative for vaginal bleeding.  Musculoskeletal: Negative for arthralgias and myalgias.  Skin: Positive for color change.  Allergic/Immunologic: Negative for immunocompromised state.  Psychiatric/Behavioral:  Negative for confusion.   10 Systems reviewed and are negative for acute change except as noted in the HPI.   Physical Exam Updated Vital Signs BP 113/100 (BP Location: Left Arm)   Pulse 62   Temp 98.4 F (36.9 C) (Oral)   Resp 18   Ht 5\' 9"  (1.753 m)   Wt 56.7 kg   SpO2 99%   BMI 18.46 kg/m   Physical Exam  Constitutional: She is oriented to person, place, and time. Vital signs are normal. She appears well-developed and well-nourished.  Non-toxic appearance. No distress.  Afebrile, nontoxic, NAD  HENT:  Head: Normocephalic and atraumatic.  Mouth/Throat: Mucous membranes are normal.  Eyes: Conjunctivae and EOM are normal. Right eye exhibits no discharge. Left eye exhibits no discharge.  Neck: Normal range of motion. Neck supple.  Cardiovascular: Normal rate.   Pulmonary/Chest: Effort normal. No respiratory distress.  Abdominal: Normal appearance. She exhibits no distension.  Genitourinary:    Pelvic exam was performed with patient supine. There is lesion on the left labia. No vaginal discharge found.  Genitourinary Comments: Chaperone present during exam Minimally indurated and erythematous area from the bartholin's gland extending toward the inguinal crease on the L side, bartholin's gland opening draining white purulent material well, no fluctuance, minimal warmth to the skin, no red streaking. No vaginal canal discharge seen.   Musculoskeletal:  Paraplegic, baseline motor function  Neurological: She is alert and oriented to person, place, and time. She has normal strength. No sensory deficit.  Skin: Skin is warm, dry and intact. No rash noted.  Psychiatric: She has a normal mood and affect. Her behavior is normal.  Nursing note and vitals reviewed.    ED Treatments / Results  Labs (all labs ordered are listed, but only abnormal results are displayed) Labs Reviewed - No data to display  EKG  EKG Interpretation None       Radiology No results  found.  Procedures Procedures (including critical care time)  Medications Ordered in ED Medications - No data to display   Initial Impression / Assessment and Plan / ED Course  I have reviewed the triage vital signs and the nursing notes.  Pertinent labs & imaging results that were available during my care of the patient were reviewed by me and considered in my medical decision making (see chart for details).  Clinical Course    60 y.o. female here with abscess to L inguinal region tracking towards the bartholin's gland, unknown time frame since pt is a paraplegic and can't see it herself, but a friend of hers noted it yesterday. On exam, minimal redness and swelling to the L labia at the bartholin's gland, currently draining white purulent material well, induration toward the inguinal crease but no fluctuance. Appears to be draining adequately, doubt need for I&D. Will start on abx. Discussed warm compress. F/up with home health nurse/PCP in 3-4 days for recheck. I explained the diagnosis and have given explicit precautions to return to the ER including for any other new or worsening symptoms. The patient understands and accepts the medical plan as it's been dictated and I have answered their questions. Discharge instructions concerning home care and prescriptions have been given. The patient is STABLE and is discharged to home in good condition.   Final Clinical Impressions(s) / ED Diagnoses   Final diagnoses:  Bartholin's gland abscess    New Prescriptions New Prescriptions   CEPHALEXIN (KEFLEX) 500 MG CAPSULE    2 caps po bid x 7 days     Levi [employer]uss, PA-C 08/20/15 1620    Azalia Bilis, MD 08/20/15 1712

## 2015-08-20 NOTE — Discharge Instructions (Signed)
Take keflex as directed to help with your abscess. Keep area clean and dry. Apply warm compresses to affected area throughout the day, using heat pack for 20 minutes at least 2-4 times per day. Take tylenol or motrin for pain. Followup with [facility]/Primary Care doctor in 2-3 days for wound recheck. Monitor area for signs of infection to include, but not limited to: increasing pain, spreading redness, drainage/pus, worsening swelling, or fevers. Return to emergency department for emergent changing or worsening symptoms.

## 2015-08-20 NOTE — ED Triage Notes (Signed)
Patient had a reddened raised area to left groin area. Area is painful to touch. Denies drainage or fever.

## 2015-08-31 NOTE — Telephone Encounter (Signed)
OPENED IN ERROR

## 2015-11-30 ENCOUNTER — Ambulatory Visit: Payer: Medicare HMO | Admitting: Podiatry

## 2015-12-06 ENCOUNTER — Ambulatory Visit (INDEPENDENT_AMBULATORY_CARE_PROVIDER_SITE_OTHER): Payer: Medicare HMO | Admitting: Podiatry

## 2015-12-06 DIAGNOSIS — B351 Tinea unguium: Secondary | ICD-10-CM | POA: Diagnosis not present

## 2015-12-06 DIAGNOSIS — M79609 Pain in unspecified limb: Secondary | ICD-10-CM | POA: Diagnosis not present

## 2015-12-06 DIAGNOSIS — L608 Other nail disorders: Secondary | ICD-10-CM

## 2015-12-06 DIAGNOSIS — L603 Nail dystrophy: Secondary | ICD-10-CM
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
## 2016-03-04 ENCOUNTER — Encounter: Payer: Self-pay | Admitting: Podiatry

## 2016-03-04 ENCOUNTER — Ambulatory Visit (INDEPENDENT_AMBULATORY_CARE_PROVIDER_SITE_OTHER): Payer: Medicare HMO | Admitting: Podiatry

## 2016-03-04 DIAGNOSIS — L608 Other nail disorders: Secondary | ICD-10-CM

## 2016-03-04 DIAGNOSIS — L603 Nail dystrophy: Secondary | ICD-10-CM

## 2016-03-04 DIAGNOSIS — B351 Tinea unguium: Secondary | ICD-10-CM | POA: Diagnosis not present

## 2016-03-04 DIAGNOSIS — M79609 Pain in unspecified limb: Secondary | ICD-10-CM | POA: Diagnosis not present

## 2016-03-10 NOTE — Progress Notes (Signed)
   SUBJECTIVE Patient  presents to office today complaining of elongated, thickened nails. Pain while ambulating in shoes. Patient is unable to trim their own nails.   OBJECTIVE General Patient is awake, alert, and oriented x 3 and in no acute distress. Derm Skin is dry and supple bilateral. Negative open lesions or macerations. Remaining integument unremarkable. Nails are tender, long, thickened and dystrophic with subungual debris, consistent with onychomycosis, 1-5 bilateral. No signs of infection noted. Vasc  DP and PT pedal pulses palpable bilaterally. Temperature gradient within normal limits.  Neuro Epicritic and protective threshold sensation diminished bilaterally.  Musculoskeletal Exam No symptomatic pedal deformities noted bilateral. Muscular strength within normal limits.  ASSESSMENT 1. Onychodystrophic nails 1-5 bilateral with hyperkeratosis of nails.  2. Onychomycosis of nail due to dermatophyte bilateral 3. Pain in foot bilateral  PLAN OF CARE 1. Patient evaluated today.  2. Instructed to maintain good pedal hygiene and foot care.  3. Mechanical debridement of nails 1-5 bilaterally performed using a nail nipper. Filed with dremel without incident.  4. Return to clinic in 3 mos.    Lois Slagel M. Rasheedah Reis, DPM [facility]  Dr. Deane Melick M. Ruby Dilone, DPM    [address]                                        [place],  [address]                Office [phone]  Fax [phone]      

## 2016-04-12 ENCOUNTER — Other Ambulatory Visit: Payer: Self-pay | Admitting: Adult Health

## 2016-04-12 DIAGNOSIS — Z1231 Encounter for screening mammogram for malignant neoplasm of breast: Secondary | ICD-10-CM

## 2016-05-01 ENCOUNTER — Ambulatory Visit
Admission: RE | Admit: 2016-05-01 | Discharge: 2016-05-01 | Disposition: A | Discharge status: Home / Self Care | Payer: Medicare HMO | Source: Ambulatory Visit | Attending: Adult Health | Admitting: Adult Health

## 2016-05-01 DIAGNOSIS — Z1231 Encounter for screening mammogram for malignant neoplasm of breast: Secondary | ICD-10-CM

## 2016-06-03 ENCOUNTER — Ambulatory Visit (INDEPENDENT_AMBULATORY_CARE_PROVIDER_SITE_OTHER): Payer: Medicare HMO | Admitting: Podiatry

## 2016-06-03 DIAGNOSIS — B351 Tinea unguium: Secondary | ICD-10-CM

## 2016-06-03 DIAGNOSIS — L608 Other nail disorders: Secondary | ICD-10-CM

## 2016-06-03 DIAGNOSIS — M79609 Pain in unspecified limb: Secondary | ICD-10-CM

## 2016-06-03 DIAGNOSIS — L603 Nail dystrophy: Secondary | ICD-10-CM

## 2016-06-05 NOTE — Progress Notes (Signed)
   SUBJECTIVE Patient  presents to office today complaining of elongated, thickened nails. Pain while ambulating in shoes. Patient is unable to trim their own nails.   OBJECTIVE General Patient is awake, alert, and oriented x 3 and in no acute distress. Derm Skin is dry and supple bilateral. Negative open lesions or macerations. Remaining integument unremarkable. Nails are tender, long, thickened and dystrophic with subungual debris, consistent with onychomycosis, 1-5 bilateral. No signs of infection noted. Vasc  DP and PT pedal pulses palpable bilaterally. Temperature gradient within normal limits.  Neuro Epicritic and protective threshold sensation diminished bilaterally.  Musculoskeletal Exam No symptomatic pedal deformities noted bilateral. Muscular strength within normal limits.  ASSESSMENT 1. Onychodystrophic nails 1-5 bilateral with hyperkeratosis of nails.  2. Onychomycosis of nail due to dermatophyte bilateral 3. Pain in foot bilateral  PLAN OF CARE 1. Patient evaluated today.  2. Instructed to maintain good pedal hygiene and foot care.  3. Mechanical debridement of nails 1-5 bilaterally performed using a nail nipper. Filed with dremel without incident.  4. Return to clinic in 3 mos.    Hailey Cooper M. Hailey Cooper, DPM [facility]  Dr. Jaxyn Cooper M. Hailey Cooper, DPM    [address]                                        [place], [place] [address]                Office [phone]  Fax [phone]      

## 2016-06-06 ENCOUNTER — Inpatient Hospital Stay (HOSPITAL_COMMUNITY)
Admission: EM | Admit: 2016-06-06 | Discharge: 2016-06-28 | DRG: 871 | Disposition: E | Payer: Medicare HMO | Attending: Internal Medicine | Admitting: Internal Medicine

## 2016-06-06 ENCOUNTER — Emergency Department (HOSPITAL_COMMUNITY): Payer: Medicare HMO

## 2016-06-06 ENCOUNTER — Encounter (HOSPITAL_COMMUNITY): Payer: Self-pay | Admitting: *Deleted

## 2016-06-06 DIAGNOSIS — M1991 Primary osteoarthritis, unspecified site: Secondary | ICD-10-CM | POA: Diagnosis present

## 2016-06-06 DIAGNOSIS — Z87442 Personal history of urinary calculi: Secondary | ICD-10-CM | POA: Diagnosis not present

## 2016-06-06 DIAGNOSIS — A419 Sepsis, unspecified organism: Secondary | ICD-10-CM | POA: Diagnosis present

## 2016-06-06 DIAGNOSIS — Z79899 Other long term (current) drug therapy: Secondary | ICD-10-CM | POA: Diagnosis not present

## 2016-06-06 DIAGNOSIS — G8220 Paraplegia, unspecified: Secondary | ICD-10-CM | POA: Diagnosis present

## 2016-06-06 DIAGNOSIS — Z833 Family history of diabetes mellitus: Secondary | ICD-10-CM | POA: Diagnosis not present

## 2016-06-06 DIAGNOSIS — Z8249 Family history of ischemic heart disease and other diseases of the circulatory system: Secondary | ICD-10-CM | POA: Diagnosis not present

## 2016-06-06 DIAGNOSIS — J90 Pleural effusion, not elsewhere classified: Secondary | ICD-10-CM | POA: Diagnosis present

## 2016-06-06 DIAGNOSIS — Z882 Allergy status to sulfonamides status: Secondary | ICD-10-CM

## 2016-06-06 DIAGNOSIS — K219 Gastro-esophageal reflux disease without esophagitis: Secondary | ICD-10-CM | POA: Diagnosis present

## 2016-06-06 DIAGNOSIS — F329 Major depressive disorder, single episode, unspecified: Secondary | ICD-10-CM | POA: Diagnosis present

## 2016-06-06 DIAGNOSIS — G894 Chronic pain syndrome: Secondary | ICD-10-CM | POA: Diagnosis present

## 2016-06-06 DIAGNOSIS — D696 Thrombocytopenia, unspecified: Secondary | ICD-10-CM | POA: Diagnosis not present

## 2016-06-06 DIAGNOSIS — A414 Sepsis due to anaerobes: Secondary | ICD-10-CM

## 2016-06-06 DIAGNOSIS — G8921 Chronic pain due to trauma: Secondary | ICD-10-CM | POA: Diagnosis not present

## 2016-06-06 DIAGNOSIS — Z9109 Other allergy status, other than to drugs and biological substances: Secondary | ICD-10-CM | POA: Diagnosis not present

## 2016-06-06 DIAGNOSIS — R0902 Hypoxemia: Secondary | ICD-10-CM

## 2016-06-06 DIAGNOSIS — Z87440 Personal history of urinary (tract) infections: Secondary | ICD-10-CM | POA: Diagnosis not present

## 2016-06-06 DIAGNOSIS — J9601 Acute respiratory failure with hypoxia: Secondary | ICD-10-CM

## 2016-06-06 DIAGNOSIS — N390 Urinary tract infection, site not specified: Secondary | ICD-10-CM | POA: Diagnosis present

## 2016-06-06 DIAGNOSIS — R7881 Bacteremia: Secondary | ICD-10-CM | POA: Diagnosis not present

## 2016-06-06 DIAGNOSIS — R410 Disorientation, unspecified: Secondary | ICD-10-CM

## 2016-06-06 DIAGNOSIS — G9341 Metabolic encephalopathy: Secondary | ICD-10-CM

## 2016-06-06 DIAGNOSIS — N10 Acute tubulo-interstitial nephritis: Secondary | ICD-10-CM

## 2016-06-06 DIAGNOSIS — F17210 Nicotine dependence, cigarettes, uncomplicated: Secondary | ICD-10-CM | POA: Diagnosis present

## 2016-06-06 DIAGNOSIS — R519 Headache, unspecified: Secondary | ICD-10-CM

## 2016-06-06 DIAGNOSIS — Z7982 Long term (current) use of aspirin: Secondary | ICD-10-CM

## 2016-06-06 DIAGNOSIS — D6959 Other secondary thrombocytopenia: Secondary | ICD-10-CM | POA: Diagnosis present

## 2016-06-06 DIAGNOSIS — Z91040 Latex allergy status: Secondary | ICD-10-CM | POA: Diagnosis not present

## 2016-06-06 DIAGNOSIS — B961 Klebsiella pneumoniae [K. pneumoniae] as the cause of diseases classified elsewhere: Secondary | ICD-10-CM | POA: Diagnosis not present

## 2016-06-06 DIAGNOSIS — I959 Hypotension, unspecified: Secondary | ICD-10-CM | POA: Diagnosis not present

## 2016-06-06 DIAGNOSIS — F064 Anxiety disorder due to known physiological condition: Secondary | ICD-10-CM | POA: Diagnosis present

## 2016-06-06 DIAGNOSIS — A4159 Other Gram-negative sepsis: Secondary | ICD-10-CM | POA: Diagnosis not present

## 2016-06-06 DIAGNOSIS — R51 Headache: Secondary | ICD-10-CM

## 2016-06-06 LAB — COMPREHENSIVE METABOLIC PANEL
ALK PHOS: 175 U/L — AB (ref 38–126)
ALT: 20 U/L (ref 14–54)
ANION GAP: 9 (ref 5–15)
AST: 34 U/L (ref 15–41)
Albumin: 3.2 g/dL — ABNORMAL LOW (ref 3.5–5.0)
BILIRUBIN TOTAL: 0.8 mg/dL (ref 0.3–1.2)
BUN: 25 mg/dL — ABNORMAL HIGH (ref 6–20)
CALCIUM: 9.4 mg/dL (ref 8.9–10.3)
CO2: 24 mmol/L (ref 22–32)
Chloride: 105 mmol/L (ref 101–111)
Creatinine, Ser: 0.84 mg/dL (ref 0.44–1.00)
Glucose, Bld: 138 mg/dL — ABNORMAL HIGH (ref 65–99)
Potassium: 4 mmol/L (ref 3.5–5.1)
Sodium: 138 mmol/L (ref 135–145)
TOTAL PROTEIN: 6.3 g/dL — AB (ref 6.5–8.1)

## 2016-06-06 LAB — CBC WITH DIFFERENTIAL/PLATELET
BASOS ABS: 0 10*3/uL (ref 0.0–0.1)
BASOS PCT: 0 %
Eosinophils Absolute: 0 10*3/uL (ref 0.0–0.7)
Eosinophils Relative: 0 %
HEMATOCRIT: 40.6 % (ref 36.0–46.0)
HEMOGLOBIN: 13.7 g/dL (ref 12.0–15.0)
Lymphocytes Relative: 7 %
Lymphs Abs: 0.3 10*3/uL — ABNORMAL LOW (ref 0.7–4.0)
MCH: 32.9 pg (ref 26.0–34.0)
MCHC: 33.7 g/dL (ref 30.0–36.0)
MCV: 97.6 fL (ref 78.0–100.0)
MONO ABS: 0.1 10*3/uL (ref 0.1–1.0)
Monocytes Relative: 3 %
NEUTROS PCT: 90 %
Neutro Abs: 4.5 10*3/uL (ref 1.7–7.7)
Platelets: 128 10*3/uL — ABNORMAL LOW (ref 150–400)
RBC: 4.16 MIL/uL (ref 3.87–5.11)
RDW: 14.4 % (ref 11.5–15.5)
WBC: 5 10*3/uL (ref 4.0–10.5)

## 2016-06-06 LAB — URINALYSIS, ROUTINE W REFLEX MICROSCOPIC
Bilirubin Urine: NEGATIVE
Glucose, UA: NEGATIVE mg/dL
Ketones, ur: NEGATIVE mg/dL
NITRITE: POSITIVE — AB
PROTEIN: 100 mg/dL — AB
SPECIFIC GRAVITY, URINE: 1.01 (ref 1.005–1.030)
pH: 7 (ref 5.0–8.0)

## 2016-06-06 LAB — I-STAT CG4 LACTIC ACID, ED
LACTIC ACID, VENOUS: 2.69 mmol/L — AB (ref 0.5–1.9)
Lactic Acid, Venous: 4.44 mmol/L (ref 0.5–1.9)

## 2016-06-06 LAB — LACTIC ACID, PLASMA: Lactic Acid, Venous: 3.1 mmol/L (ref 0.5–1.9)

## 2016-06-06 MED ORDER — ONDANSETRON HCL 4 MG/2ML IJ SOLN: 4.0000 mg | Freq: Four times a day (QID) | INTRAMUSCULAR | Status: DC | PRN

## 2016-06-06 MED ORDER — SODIUM CHLORIDE 0.9 % IV BOLUS (SEPSIS)
1000.0000 mL | Freq: Once | INTRAVENOUS | Status: AC
  Administered 2016-06-06: 1000 mL via INTRAVENOUS

## 2016-06-06 MED ORDER — SODIUM CHLORIDE 0.9 % IV SOLN
INTRAVENOUS | Status: AC
  Administered 2016-06-06: 23:00:00 via INTRAVENOUS

## 2016-06-06 MED ORDER — AMITRIPTYLINE HCL 10 MG PO TABS
10.0000 mg | ORAL_TABLET | Freq: Two times a day (BID) | ORAL | Status: DC
  Administered 2016-06-06 – 2016-06-07 (×3): 10 mg via ORAL
  Filled 2016-06-06 (×4): qty 1

## 2016-06-06 MED ORDER — CHLORHEXIDINE GLUCONATE 0.12 % MT SOLN
15.0000 mL | Freq: Two times a day (BID) | OROMUCOSAL | Status: DC
  Administered 2016-06-07 – 2016-06-08 (×3): 15 mL via OROMUCOSAL
  Filled 2016-06-06 (×2): qty 15

## 2016-06-06 MED ORDER — ONDANSETRON HCL 4 MG PO TABS: 4.0000 mg | ORAL_TABLET | Freq: Four times a day (QID) | ORAL | Status: DC | PRN

## 2016-06-06 MED ORDER — OXYBUTYNIN CHLORIDE 5 MG PO TABS
5.0000 mg | ORAL_TABLET | Freq: Two times a day (BID) | ORAL | Status: DC
  Administered 2016-06-06 – 2016-06-07 (×3): 5 mg via ORAL
  Filled 2016-06-06 (×3): qty 1

## 2016-06-06 MED ORDER — BACLOFEN 10 MG PO TABS
20.0000 mg | ORAL_TABLET | Freq: Four times a day (QID) | ORAL | Status: DC
  Administered 2016-06-06 – 2016-06-07 (×5): 20 mg via ORAL
  Filled 2016-06-06 (×6): qty 2

## 2016-06-06 MED ORDER — ENOXAPARIN SODIUM 40 MG/0.4ML ~~LOC~~ SOLN
40.0000 mg | Freq: Every day | SUBCUTANEOUS | Status: DC
  Administered 2016-06-06 – 2016-06-07 (×2): 40 mg via SUBCUTANEOUS
  Filled 2016-06-06 (×2): qty 0.4

## 2016-06-06 MED ORDER — OXYCODONE HCL 5 MG PO TABS
5.0000 mg | ORAL_TABLET | ORAL | Status: DC | PRN
Start: 2016-06-06 — End: 2016-06-08
  Administered 2016-06-06 – 2016-06-07 (×4): 5 mg via ORAL
  Filled 2016-06-06 (×4): qty 1

## 2016-06-06 MED ORDER — SODIUM CHLORIDE 0.9 % IV BOLUS (SEPSIS)
500.0000 mL | Freq: Once | INTRAVENOUS | Status: AC
  Administered 2016-06-06: 500 mL via INTRAVENOUS

## 2016-06-06 MED ORDER — ASPIRIN EC 81 MG PO TBEC
81.0000 mg | DELAYED_RELEASE_TABLET | Freq: Two times a day (BID) | ORAL | Status: DC
  Administered 2016-06-06 – 2016-06-07 (×3): 81 mg via ORAL
  Filled 2016-06-06 (×3): qty 1

## 2016-06-06 MED ORDER — SERTRALINE HCL 50 MG PO TABS
50.0000 mg | ORAL_TABLET | Freq: Every day | ORAL | Status: DC
  Administered 2016-06-07: 50 mg via ORAL
  Filled 2016-06-06: qty 1

## 2016-06-06 MED ORDER — ACETAMINOPHEN 325 MG PO TABS
650.0000 mg | ORAL_TABLET | Freq: Four times a day (QID) | ORAL | Status: DC | PRN
  Administered 2016-06-07: 650 mg via ORAL
  Filled 2016-06-06: qty 2

## 2016-06-06 MED ORDER — SODIUM CHLORIDE 0.9 % IV BOLUS (SEPSIS)
250.0000 mL | Freq: Once | INTRAVENOUS | Status: AC
  Administered 2016-06-06: 250 mL via INTRAVENOUS

## 2016-06-06 MED ORDER — DEXTROSE 5 % IV SOLN
1.0000 g | Freq: Once | INTRAVENOUS | Status: AC
  Administered 2016-06-06: 1 g via INTRAVENOUS
  Filled 2016-06-06: qty 10

## 2016-06-06 MED ORDER — DEXTROSE 5 % IV SOLN: 1.0000 g | INTRAVENOUS | Status: DC

## 2016-06-06 MED ORDER — ORAL CARE MOUTH RINSE
15.0000 mL | Freq: Two times a day (BID) | OROMUCOSAL | Status: DC
  Administered 2016-06-07 – 2016-06-09 (×2): 15 mL via OROMUCOSAL

## 2016-06-06 MED ORDER — ACETAMINOPHEN 650 MG RE SUPP: 650.0000 mg | Freq: Four times a day (QID) | RECTAL | Status: DC | PRN

## 2016-06-06 NOTE — ED Triage Notes (Signed)
Per EMS, pt reports feeling flushed in face, chills, nausea, confusion at ~1745 today. Pt is paraplegic. Pt was confused upon EMS arrival. Pt took zofran this morning, hydrocodone at 1600.   Pt reports painful urination for the past couple of days. Pt had UA performed at PCP, which was negative.  Pt has DNR.

## 2016-06-06 NOTE — ED Notes (Signed)
Bed: [id number] Expected date:  Expected time:  Means of arrival:  Comments: EMS 61 y/o AMS
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
## 2016-06-07 ENCOUNTER — Inpatient Hospital Stay (HOSPITAL_COMMUNITY): Payer: Medicare HMO

## 2016-06-07 DIAGNOSIS — G8220 Paraplegia, unspecified: Secondary | ICD-10-CM

## 2016-06-07 DIAGNOSIS — A419 Sepsis, unspecified organism: Principal | ICD-10-CM

## 2016-06-07 DIAGNOSIS — D696 Thrombocytopenia, unspecified: Secondary | ICD-10-CM

## 2016-06-07 DIAGNOSIS — N390 Urinary tract infection, site not specified: Secondary | ICD-10-CM

## 2016-06-07 DIAGNOSIS — G9341 Metabolic encephalopathy: Secondary | ICD-10-CM

## 2016-06-07 LAB — BLOOD CULTURE ID PANEL (REFLEXED)
Acinetobacter baumannii: NOT DETECTED
CANDIDA ALBICANS: NOT DETECTED
CANDIDA GLABRATA: NOT DETECTED
CANDIDA PARAPSILOSIS: NOT DETECTED
CANDIDA TROPICALIS: NOT DETECTED
Candida krusei: NOT DETECTED
Carbapenem resistance: NOT DETECTED
ESCHERICHIA COLI: NOT DETECTED
Enterobacter cloacae complex: NOT DETECTED
Enterobacteriaceae species: DETECTED — AB
Enterococcus species: NOT DETECTED
Haemophilus influenzae: NOT DETECTED
KLEBSIELLA PNEUMONIAE: DETECTED — AB
Klebsiella oxytoca: NOT DETECTED
Listeria monocytogenes: NOT DETECTED
Neisseria meningitidis: NOT DETECTED
PROTEUS SPECIES: NOT DETECTED
Pseudomonas aeruginosa: NOT DETECTED
Serratia marcescens: NOT DETECTED
Staphylococcus aureus (BCID): NOT DETECTED
Staphylococcus species: NOT DETECTED
Streptococcus agalactiae: NOT DETECTED
Streptococcus pneumoniae: NOT DETECTED
Streptococcus pyogenes: NOT DETECTED
Streptococcus species: NOT DETECTED

## 2016-06-07 LAB — BASIC METABOLIC PANEL
ANION GAP: 7 (ref 5–15)
BUN: 23 mg/dL — AB (ref 6–20)
CHLORIDE: 111 mmol/L (ref 101–111)
CO2: 20 mmol/L — ABNORMAL LOW (ref 22–32)
Calcium: 8 mg/dL — ABNORMAL LOW (ref 8.9–10.3)
Creatinine, Ser: 0.74 mg/dL (ref 0.44–1.00)
GFR calc Af Amer: >60 mL/min (ref >60)
GFR calc non Af Amer: >60 mL/min (ref >60)
Glucose, Bld: 99 mg/dL (ref 65–99)
POTASSIUM: 4.2 mmol/L (ref 3.5–5.1)
SODIUM: 138 mmol/L (ref 135–145)

## 2016-06-07 LAB — URINALYSIS, COMPLETE (UACMP) WITH MICROSCOPIC
Bilirubin Urine: NEGATIVE
Glucose, UA: NEGATIVE mg/dL
Ketones, ur: NEGATIVE mg/dL
NITRITE: NEGATIVE
PROTEIN: 30 mg/dL — AB
Specific Gravity, Urine: 1.02 (ref 1.005–1.030)
pH: 5 (ref 5.0–8.0)

## 2016-06-07 LAB — CBC
HEMATOCRIT: 35 % — AB (ref 36.0–46.0)
HEMOGLOBIN: 11.9 g/dL — AB (ref 12.0–15.0)
MCH: 33.5 pg (ref 26.0–34.0)
MCHC: 34 g/dL (ref 30.0–36.0)
MCV: 98.6 fL (ref 78.0–100.0)
PLATELETS: 99 10*3/uL — AB (ref 150–400)
RBC: 3.55 MIL/uL — ABNORMAL LOW (ref 3.87–5.11)
RDW: 14.4 % (ref 11.5–15.5)
WBC: 9.3 10*3/uL (ref 4.0–10.5)

## 2016-06-07 LAB — MRSA PCR SCREENING: MRSA by PCR: NEGATIVE

## 2016-06-07 LAB — TSH: TSH: 1.139 u[IU]/mL (ref 0.350–4.500)

## 2016-06-07 LAB — LACTIC ACID, PLASMA
LACTIC ACID, VENOUS: 1.7 mmol/L (ref 0.5–1.9)
LACTIC ACID, VENOUS: 1.7 mmol/L (ref 0.5–1.9)
Lactic Acid, Venous: 1.8 mmol/L (ref 0.5–1.9)

## 2016-06-07 LAB — T4, FREE: Free T4: 1.09 ng/dL (ref 0.61–1.12)

## 2016-06-07 LAB — VITAMIN B12: Vitamin B-12: 1079 pg/mL — ABNORMAL HIGH (ref 180–914)

## 2016-06-07 LAB — AMMONIA: AMMONIA: 28 umol/L (ref 9–35)

## 2016-06-07 MED ORDER — CEFTRIAXONE SODIUM 2 G IJ SOLR
2.0000 g | Freq: Every day | INTRAMUSCULAR | Status: DC
  Administered 2016-06-07: 2 g via INTRAVENOUS
  Filled 2016-06-07 (×2): qty 2

## 2016-06-07 MED ORDER — SODIUM CHLORIDE 0.9 % IV BOLUS (SEPSIS)
1000.0000 mL | Freq: Once | INTRAVENOUS | Status: AC
  Administered 2016-06-07: 1000 mL via INTRAVENOUS

## 2016-06-07 MED ORDER — HALOPERIDOL LACTATE 5 MG/ML IJ SOLN
5.0000 mg | Freq: Once | INTRAMUSCULAR | Status: DC
  Filled 2016-06-07: qty 1

## 2016-06-07 MED ORDER — MORPHINE SULFATE (PF) 4 MG/ML IV SOLN
2.0000 mg | Freq: Once | INTRAVENOUS | Status: AC
  Administered 2016-06-07: 2 mg via INTRAVENOUS
  Filled 2016-06-07: qty 1

## 2016-06-07 MED ORDER — LORAZEPAM 2 MG/ML IJ SOLN
0.5000 mg | INTRAMUSCULAR | Status: DC | PRN
  Administered 2016-06-07 (×2): 0.5 mg via INTRAVENOUS
  Administered 2016-06-08: 2 mg via INTRAVENOUS
  Filled 2016-06-07 (×3): qty 1

## 2016-06-07 MED ORDER — ENSURE ENLIVE PO LIQD
237.0000 mL | Freq: Two times a day (BID) | ORAL | Status: DC
  Administered 2016-06-07: 237 mL via ORAL
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
## 2016-06-07 NOTE — Progress Notes (Signed)
eLink Physician-Brief Progress Note Patient Name: Arnetha Massyeresa L Harnish DOB: [birth year] MRN: [id number]   Date of Service  06/07/2016  HPI/Events of Note  Patient woke up agitated with drop in sats.  Now on NRB.  Given 0.5 mg ativan with no real change in agitation.  QTc less than 500  eICU Interventions  5 mg haldol IV times one Nurse to call back if no change in patient status within 30 minutes     Intervention Category Major Interventions: Delirium, psychosis, severe agitation - evaluation and management  Razi Hickle 06/07/2016, 11:51 PM
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
## 2016-06-07 NOTE — Progress Notes (Signed)
PHARMACY - PHYSICIAN COMMUNICATION CRITICAL VALUE ALERT - BLOOD CULTURE IDENTIFICATION (BCID)  Results for orders placed or performed during the hospital encounter of 04/12/16  Blood Culture ID Panel (Reflexed) (Collected: October 30, 2016  8:05 PM)  Result Value Ref Range   Enterococcus species NOT DETECTED NOT DETECTED   Listeria monocytogenes NOT DETECTED NOT DETECTED   Staphylococcus species NOT DETECTED NOT DETECTED   Staphylococcus aureus NOT DETECTED NOT DETECTED   Streptococcus species NOT DETECTED NOT DETECTED   Streptococcus agalactiae NOT DETECTED NOT DETECTED   Streptococcus pneumoniae NOT DETECTED NOT DETECTED   Streptococcus pyogenes NOT DETECTED NOT DETECTED   Acinetobacter baumannii NOT DETECTED NOT DETECTED   Enterobacteriaceae species DETECTED (A) NOT DETECTED   Enterobacter cloacae complex NOT DETECTED NOT DETECTED   Escherichia coli NOT DETECTED NOT DETECTED   Klebsiella oxytoca NOT DETECTED NOT DETECTED   Klebsiella pneumoniae DETECTED (A) NOT DETECTED   Proteus species NOT DETECTED NOT DETECTED   Serratia marcescens NOT DETECTED NOT DETECTED   Carbapenem resistance NOT DETECTED NOT DETECTED   Haemophilus influenzae NOT DETECTED NOT DETECTED   Neisseria meningitidis NOT DETECTED NOT DETECTED   Pseudomonas aeruginosa NOT DETECTED NOT DETECTED   Candida albicans NOT DETECTED NOT DETECTED   Candida glabrata NOT DETECTED NOT DETECTED   Candida krusei NOT DETECTED NOT DETECTED   Candida parapsilosis NOT DETECTED NOT DETECTED   Candida tropicalis NOT DETECTED NOT DETECTED    Name of physician (or Provider) Contacted:  Dr. Arbutus Leasat  Changes to prescribed antibiotics required: None. Recommend continuing ceftriaxone 2 grams IV q24h pending report of susceptibilities.  Elie Goodyandy Kayceon Oki, PharmD, BCPS Pager: [phone] 06/07/2016  1:34 PM

## 2016-06-07 NOTE — Progress Notes (Signed)
CRITICAL VALUE ALERT  Critical value received:  Lactic acid 3.1  Date of notification:  06/07/16  Time of notification:  0000  Critical value read back:Yes.    Nurse who received alert:  Effie BerkshireAlex Sharnette Kitamura  MD notified (1st page):  R Onalee Huaavid  Time of first page:  0010  MD notified (2nd page):  Time of second page:  Responding MD:  Vania Rea David  Time MD responded:  [id number]

## 2016-06-08 ENCOUNTER — Inpatient Hospital Stay (HOSPITAL_COMMUNITY): Payer: Medicare HMO

## 2016-06-08 DIAGNOSIS — A4159 Other Gram-negative sepsis: Secondary | ICD-10-CM

## 2016-06-08 DIAGNOSIS — R7881 Bacteremia: Secondary | ICD-10-CM

## 2016-06-08 DIAGNOSIS — J9621 Acute and chronic respiratory failure with hypoxia: Secondary | ICD-10-CM | POA: Insufficient documentation

## 2016-06-08 DIAGNOSIS — J9601 Acute respiratory failure with hypoxia: Secondary | ICD-10-CM

## 2016-06-08 DIAGNOSIS — D696 Thrombocytopenia, unspecified: Secondary | ICD-10-CM

## 2016-06-08 DIAGNOSIS — B961 Klebsiella pneumoniae [K. pneumoniae] as the cause of diseases classified elsewhere: Secondary | ICD-10-CM

## 2016-06-08 DIAGNOSIS — G8921 Chronic pain due to trauma: Secondary | ICD-10-CM

## 2016-06-08 DIAGNOSIS — A414 Sepsis due to anaerobes: Secondary | ICD-10-CM

## 2016-06-08 LAB — URINE CULTURE

## 2016-06-08 LAB — GLUCOSE, CAPILLARY
Glucose-Capillary: 150 mg/dL — ABNORMAL HIGH (ref 65–99)
Glucose-Capillary: 68 mg/dL (ref 65–99)

## 2016-06-08 MED ORDER — SODIUM CHLORIDE 0.9 % IV SOLN
2.0000 mg/h | INTRAVENOUS | Status: DC
  Administered 2016-06-08: 2 mg/h via INTRAVENOUS
  Filled 2016-06-08: qty 10

## 2016-06-08 MED ORDER — SODIUM CHLORIDE 0.9 % IV BOLUS (SEPSIS)
500.0000 mL | Freq: Once | INTRAVENOUS | Status: AC
  Administered 2016-06-08: 500 mL via INTRAVENOUS

## 2016-06-08 MED ORDER — MORPHINE SULFATE (PF) 4 MG/ML IV SOLN: 1.0000 mg | INTRAVENOUS | Status: DC | PRN

## 2016-06-08 MED ORDER — VANCOMYCIN HCL IN DEXTROSE 750-5 MG/150ML-% IV SOLN
750.0000 mg | Freq: Two times a day (BID) | INTRAVENOUS | Status: DC
  Administered 2016-06-08: 750 mg via INTRAVENOUS
  Filled 2016-06-08 (×2): qty 150

## 2016-06-08 MED ORDER — MORPHINE SULFATE (PF) 4 MG/ML IV SOLN: 2.0000 mg | INTRAVENOUS | Status: DC | PRN

## 2016-06-08 MED ORDER — DEXTROSE 50 % IV SOLN
INTRAVENOUS | Status: AC
  Filled 2016-06-08: qty 50

## 2016-06-08 MED ORDER — DEXTROSE 50 % IV SOLN
25.0000 mL | Freq: Once | INTRAVENOUS | Status: AC
  Administered 2016-06-08: 25 mL via INTRAVENOUS

## 2016-06-08 MED ORDER — PIPERACILLIN-TAZOBACTAM 3.375 G IVPB
3.3750 g | Freq: Three times a day (TID) | INTRAVENOUS | Status: DC
  Administered 2016-06-08: 3.375 g via INTRAVENOUS
  Filled 2016-06-08: qty 50

## 2016-06-08 MED ORDER — SODIUM CHLORIDE 0.9 % IV BOLUS (SEPSIS)
1000.0000 mL | Freq: Once | INTRAVENOUS | Status: AC
  Administered 2016-06-08: 1000 mL via INTRAVENOUS

## 2016-06-08 MED ORDER — MORPHINE SULFATE (PF) 4 MG/ML IV SOLN
4.0000 mg | Freq: Once | INTRAVENOUS | Status: AC
  Administered 2016-06-08: 4 mg via INTRAVENOUS

## 2016-06-08 MED ORDER — MORPHINE SULFATE (PF) 4 MG/ML IV SOLN
INTRAVENOUS | Status: AC
  Administered 2016-06-08: 4 mg via INTRAVENOUS
  Filled 2016-06-08: qty 1

## 2016-06-08 MED ORDER — LORAZEPAM 2 MG/ML IJ SOLN
INTRAMUSCULAR | Status: AC
  Administered 2016-06-08: 2 mg via INTRAVENOUS
  Filled 2016-06-08: qty 1

## 2016-06-08 MED ORDER — LORAZEPAM 2 MG/ML IJ SOLN
2.0000 mg | INTRAMUSCULAR | Status: DC | PRN
  Administered 2016-06-08: 2 mg via INTRAVENOUS

## 2016-06-08 MED ORDER — MORPHINE SULFATE (PF) 4 MG/ML IV SOLN: 4.0000 mg | INTRAVENOUS | Status: DC | PRN

## 2016-06-08 NOTE — Progress Notes (Signed)
Patient's bother at her bedside.

## 2016-06-08 NOTE — Progress Notes (Signed)
Found PT on [place]. BiPAP remained ordered as continuous (change to BiPAP PRN).

## 2016-06-08 NOTE — Progress Notes (Signed)
I had another discussion with the patient's full family at the bedside including her niece and brother present. I updated them on the patient's condition and reinforced poor prognosis and continued deterioration despite optimal therapy.  They are all in agreement to transition pt to focus on full comfort.  The patient will be removed from BiPaP and placed on nasal cannula.  After removal BiPAP, the patient became agitated. Morphine 4 mg was given. Unfortunately, the patient continued to remain agitated. Ativan 2 mg IV was then ordered with an additional 4 mg of morphine. The patient will be started on a morphine drip with morphine 4 mg IV every one hours for breakthrough agitation and distress.  DTat

## 2016-06-08 NOTE — Plan of Care (Signed)
The patient has been seen several times through the night due to hypotension, hypoxia and respiratory distress. Hypotension has failed to respond to fluids and after discussing with the case with PCCM, given that the patient is DO NOT RESUSCITATE pressors were not suggested. Chest radiograph showed diffuse bilateral airspace disease and bilateral pleural effusions greater on the right. She was started on BiPAP ventilation which has resulted in mild improvement of the patient. She was started on vancomycin and Zosyn per pharmacy. Her critical clinical status was discussed with her brother, who voiced understanding and does not want any aggressive measures. Prognosis remains poor.  Sanda Kleinavid Ortiz, M.D. [phone].

## 2016-06-08 NOTE — Progress Notes (Signed)
Pt's brother, sister, sister-in-law and niece were bedside when I arrived. They were at peace and spoke of the faith of the patient and their faith as believers. Family wanted prayer, which we had at bedside. Please page if additional support is needed. [phone]  Chaplain Marjory LiesPamela Carrington Holder, M.Div.   06/08/16 1600  Clinical Encounter Type  Visited With Family

## 2016-06-08 NOTE — Progress Notes (Signed)
Hypoglycemic Event  CBG: 68 @ 0443  Treatment: D50 IV 25 mL  Symptoms: Nervous/irritable  Follow-up CBG: Time: 0512 CBG Result: 150  Possible Reasons for Event: Inadequate meal intake  Comments/MD notified:Dr. Shawnee Knapprtis, M.    Shanieka Blea, Lilia ProQueeneth Chibuzo

## 2016-06-08 NOTE — Progress Notes (Signed)
Called patient's brother to update him on her condition. Patient's Systolic BP dropped to the low 70s, not responding to 1.5L fluid bolus, chest xray also showed aspiration pneumonia. Pt has a DNR code status, order received for BIPAP for respiratory support.

## 2016-06-08 NOTE — Progress Notes (Signed)
Updated on last evening's events and reviewed chart. Pt is now unresponsive and hypotensive on bipap. Brother at bedside who is patient's next of kin and HPOA. I spoke and updated brother.  Discussed poor prognosis with him. He did not want any further heroic measures.  He want to switch focus of care to full comfort, but he is hopeful that patient can "hold out" until rest of family arrives later this morning.  I told him she may die even within that short time and he expressed understanding, and confirmed he did not want any further aggressive measures.  I will keep pt on bipap until family arrives to which brother was agreeable.  Full note to follow.  DTat

## 2016-06-08 NOTE — Progress Notes (Signed)
Pharmacy Antibiotic Note  Hailey Cooper is a 61 y.o. female admitted on 04/03/16 with pneumonia.  Pharmacy has been consulted for Vancomycin and Zosyn dosing.  Plan: Vancomycin 750mg   IV every 12 hours.  Goal trough 15-20 mcg/mL. Zosyn 3.375g IV q8h (4 hour infusion).  Height: 5\' 9"  (175.3 cm) Weight: 142 lb 13.7 oz (64.8 kg) IBW/kg (Calculated) : 66.2  Temp (24hrs), Avg:98.3 F (36.8 C), Min:98 F (36.7 C), Max:98.6 F (37 C)   Recent Labs Lab 10-23-16 1945  10-23-16 2229 10-23-16 2314 06/07/16 0211 06/07/16 0513 06/07/16 0847  WBC 5.0  --   --   --  9.3  --   --   CREATININE 0.84  --   --   --  0.74  --   --   LATICACIDVEN  --   < > 2.69* 3.1* 1.7 1.8 1.7  < > = values in this interval not displayed.  Estimated Creatinine Clearance: 73.6 mL/min (by C-G formula based on SCr of 0.74 mg/dL).    Allergies  Allergen Reactions  . [place] Oil Anaphylaxis    Has epi pen PRN. Never had to use it.   . Sulfa Antibiotics Anaphylaxis    Unknown reaction  . Latex Hives    Antimicrobials this admission: Vancomycin 06/08/2016 >> Zosyn 06/08/2016 >> Ceftriaxone 5/11 >> 5/12  Dose adjustments this admission: -  Microbiology results: Pending  Thank you for allowing pharmacy to be a part of this patient's care.  Aleene DavidsonGrimsley Jr, Jareli Highland Crowford 06/08/2016 4:11 AM

## 2016-06-08 NOTE — Progress Notes (Signed)
Pt comfort care, BIPAP not needed at this time.  RT to monitor and assess as needed.
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
## 2016-06-09 LAB — CULTURE, BLOOD (ROUTINE X 2)
SPECIAL REQUESTS: ADEQUATE
SPECIAL REQUESTS: ADEQUATE

## 2016-06-26 ENCOUNTER — Ambulatory Visit: Payer: Medicare HMO | Admitting: Podiatry
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
## 2016-06-28 NOTE — Progress Notes (Addendum)
Nursing Note: Pt arrived via stretcher.Pt unresponsive. Pt unresponsive per report.Pt transferred to bed.0352 vitals T-97.6  P-85 R-6 BP-55/34 PO2 88% on 3L n/c.Pt checked and clean.HOB elevated.Pt resting.Family in room shortly after.Covers and pillows provided for family.Name on board and informed to all if they needed anything.wbb

## 2016-06-28 NOTE — Progress Notes (Signed)
Pt turned and repositioned q 3-4 hrs or when family allowed. Mouth care given q 3 hrs and lubricant applied to lips. Arms elevated and legs elevated, SCDS on. Pt remains unresponsive.Lower extremities beginning to mottle.Sister and niece at bedside.

## 2016-06-28 NOTE — Progress Notes (Signed)
Family at bedside all day. Called nurse Lupita LeashDonna into room to check pt. Pt was found to have no respirations or pulse @ 1820. Verified by Lupita Leashonna and Rivka Springindy Sandria Mcenroe RN's. Dr. Arbutus Leasat notified. [place] donor [facility] called, pt not a candidate. Family remains at bedside waiting for other family members to arrive.
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
## 2016-06-28 NOTE — Progress Notes (Signed)
Nursing Note: Received report from Sam,[email or web address]

## 2016-06-28 NOTE — Progress Notes (Signed)
No change.Pt resting.Remains [email or web address]

## 2016-06-28 DEATH — deceased

## 2017-06-08 IMAGING — DX DG CHEST 1V PORT
1 series · 1 of 1 positions shown · non-contrast
Comparison: 06/06/2016

CLINICAL DATA: Hypoxia.  Smoker.

EXAM:
PORTABLE CHEST 1 VIEW

[chest ap]
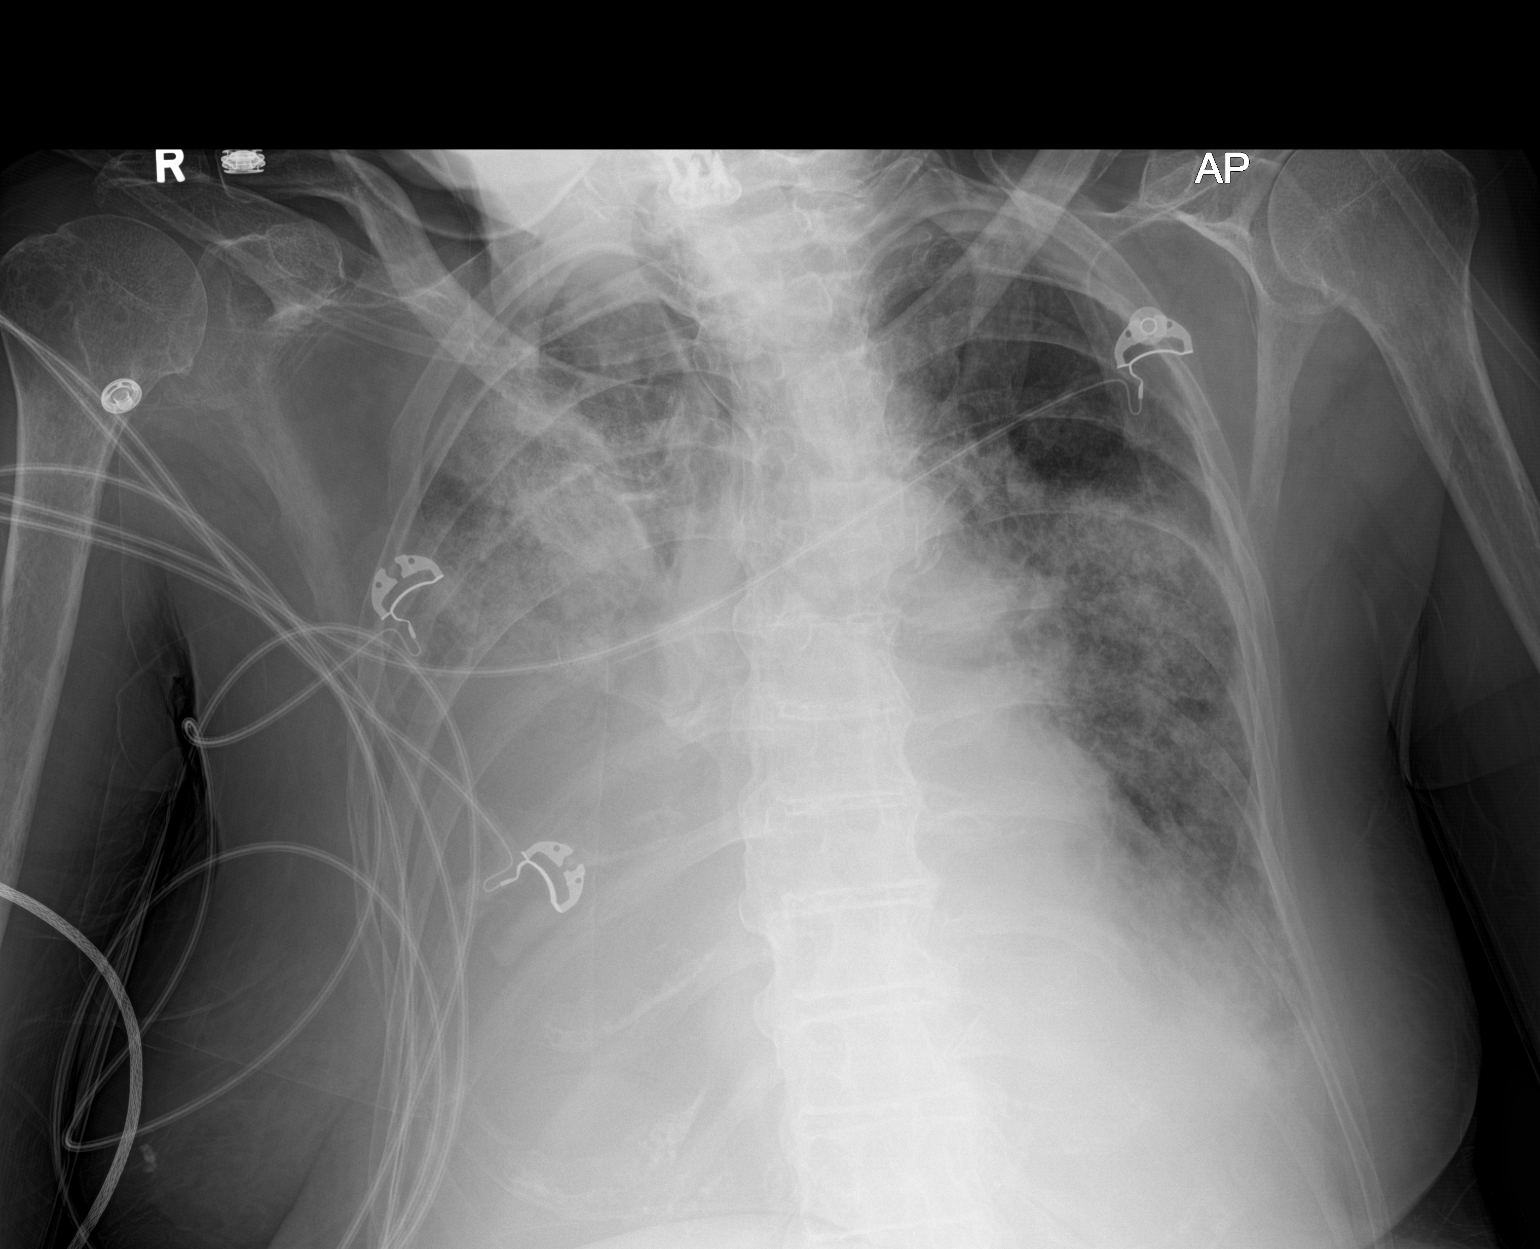

[1 of 1 positions shown; findings below may reference images not displayed]

FINDINGS: Since the previous study, there is interval development of bilateral
perihilar and basilar airspace infiltrates in the lungs. There is
more dense opacity in the right lung base which could indicate a
right pleural effusion. Small left pleural effusion is also present.
Changes could be due to edema, pneumonia, or aspiration. Heart size
is obscured by the parenchymal process. No pneumothorax.
Calcification of the aorta. Postoperative changes in the cervical
spine. Degenerative changes in the thoracic spine. Old left rib
fractures.
IMPRESSION: New development of diffuse bilateral airspace infiltration with
bilateral pleural effusions, greater on the right. Changes may
indicate edema, pneumonia, or aspiration.
